# Patient Record
Sex: Female | Born: 1940 | Race: White | Hispanic: No | Marital: Married | Smoking: Never smoker
Health system: Southern US, Community
[De-identification: ages and names within clinical notes are randomized; demographics above are authoritative.]

## PROBLEM LIST (undated history)

## (undated) DIAGNOSIS — E785 Hyperlipidemia, unspecified: Secondary | ICD-10-CM

## (undated) DIAGNOSIS — E878 Other disorders of electrolyte and fluid balance, not elsewhere classified: Secondary | ICD-10-CM

## (undated) DIAGNOSIS — I10 Essential (primary) hypertension: Secondary | ICD-10-CM

## (undated) HISTORY — PX: BLADDER SURGERY: SHX569

---

## 2010-05-07 ENCOUNTER — Emergency Department (HOSPITAL_BASED_OUTPATIENT_CLINIC_OR_DEPARTMENT_OTHER): Admission: EM | Admit: 2010-05-07 | Discharge: 2010-05-07 | Payer: Self-pay | Admitting: Emergency Medicine

## 2015-06-05 ENCOUNTER — Encounter (HOSPITAL_BASED_OUTPATIENT_CLINIC_OR_DEPARTMENT_OTHER): Payer: Self-pay | Admitting: Emergency Medicine

## 2015-06-05 ENCOUNTER — Emergency Department (HOSPITAL_BASED_OUTPATIENT_CLINIC_OR_DEPARTMENT_OTHER)
Admission: EM | Admit: 2015-06-05 | Discharge: 2015-06-06 | Disposition: A | Discharge status: Home / Self Care | Payer: Medicare Other | Attending: Emergency Medicine | Admitting: Emergency Medicine

## 2015-06-05 DIAGNOSIS — Z791 Long term (current) use of non-steroidal anti-inflammatories (NSAID): Secondary | ICD-10-CM | POA: Diagnosis not present

## 2015-06-05 DIAGNOSIS — E871 Hypo-osmolality and hyponatremia: Secondary | ICD-10-CM | POA: Insufficient documentation

## 2015-06-05 DIAGNOSIS — Z79899 Other long term (current) drug therapy: Secondary | ICD-10-CM | POA: Insufficient documentation

## 2015-06-05 DIAGNOSIS — R350 Frequency of micturition: Secondary | ICD-10-CM | POA: Diagnosis present

## 2015-06-05 DIAGNOSIS — E860 Dehydration: Secondary | ICD-10-CM | POA: Diagnosis not present

## 2015-06-05 DIAGNOSIS — N390 Urinary tract infection, site not specified: Secondary | ICD-10-CM | POA: Diagnosis not present

## 2015-06-05 DIAGNOSIS — Z7982 Long term (current) use of aspirin: Secondary | ICD-10-CM | POA: Insufficient documentation

## 2015-06-05 DIAGNOSIS — E785 Hyperlipidemia, unspecified: Secondary | ICD-10-CM | POA: Insufficient documentation

## 2015-06-05 DIAGNOSIS — N3000 Acute cystitis without hematuria: Secondary | ICD-10-CM | POA: Diagnosis not present

## 2015-06-05 DIAGNOSIS — R300 Dysuria: Secondary | ICD-10-CM

## 2015-06-05 HISTORY — DX: Other disorders of electrolyte and fluid balance, not elsewhere classified: E87.8

## 2015-06-05 LAB — URINE MICROSCOPIC-ADD ON

## 2015-06-05 LAB — URINALYSIS, ROUTINE W REFLEX MICROSCOPIC
Bilirubin Urine: NEGATIVE
GLUCOSE, UA: NEGATIVE mg/dL
HGB URINE DIPSTICK: NEGATIVE
Ketones, ur: NEGATIVE mg/dL
Leukocytes, UA: NEGATIVE
Nitrite: POSITIVE — AB
Protein, ur: NEGATIVE mg/dL
SPECIFIC GRAVITY, URINE: 1.005 (ref 1.005–1.030)
Urobilinogen, UA: 1 mg/dL (ref 0.0–1.0)
pH: 7 (ref 5.0–8.0)

## 2015-06-05 MED ORDER — ACETAMINOPHEN 325 MG PO TABS
650.0000 mg | ORAL_TABLET | Freq: Once | ORAL | Status: AC
  Administered 2015-06-06: 650 mg via ORAL
  Filled 2015-06-05: qty 2

## 2015-06-05 MED ORDER — CEPHALEXIN 250 MG PO CAPS
500.0000 mg | ORAL_CAPSULE | Freq: Once | ORAL | Status: AC
Start: 2015-06-05 — End: 2015-06-06
  Administered 2015-06-06: 500 mg via ORAL
  Filled 2015-06-05: qty 2

## 2015-06-05 MED ORDER — FLUCONAZOLE 50 MG PO TABS
150.0000 mg | ORAL_TABLET | Freq: Once | ORAL | Status: AC
  Administered 2015-06-06: 150 mg via ORAL
  Filled 2015-06-05 (×2): qty 1

## 2015-06-05 MED ORDER — ONDANSETRON 4 MG PO TBDP
4.0000 mg | ORAL_TABLET | Freq: Once | ORAL | Status: AC
  Administered 2015-06-05: 4 mg via ORAL
  Filled 2015-06-05: qty 1

## 2015-06-05 MED ORDER — PHENAZOPYRIDINE HCL 100 MG PO TABS
200.0000 mg | ORAL_TABLET | Freq: Once | ORAL | Status: AC
  Administered 2015-06-06: 200 mg via ORAL
  Filled 2015-06-05: qty 2

## 2015-06-05 MED ORDER — CEPHALEXIN 500 MG PO CAPS: 500.0000 mg | ORAL_CAPSULE | Freq: Two times a day (BID) | ORAL | Status: DC

## 2015-06-05 MED ORDER — PHENAZOPYRIDINE HCL 200 MG PO TABS: 200.0000 mg | ORAL_TABLET | Freq: Three times a day (TID) | ORAL | Status: DC

## 2015-06-05 NOTE — ED Notes (Signed)
Dr. Read Drivers at [facility]. Pt alert, NAD, restless, anxious, c/o dysuria and vaginal/pelvic discomfort, rates 4/10, also frequency and urgency and nausea (denies: sob, v,d, fever or other sx), treated for UTI, taking septra and AZO.

## 2015-06-05 NOTE — Discharge Instructions (Signed)

## 2015-06-05 NOTE — ED Notes (Signed)
Patient reports that she has a day and half of antibiotics left until completed for the UTI that she was dx with. The patient reports that she feels the symptoms coming back

## 2015-06-05 NOTE — ED Provider Notes (Signed)
CSN: [id number]     Arrival date & time 06/05/15  2031 History  This chart was scribed for Kristine Libra, MD by Lyndel Safe, ED Scribe. This patient was seen in room MH09/MH09 and the patient's care was started 11:23 PM.    Chief Complaint  Patient presents with  . Urinary Frequency   The history is provided by the patient. No language interpreter was used.   HPI Comments: Kristine Hernandez is a 74 y.o. female who presents to the Emergency Department complaining of worsening frequency and urgency with urination onset today. The pt was diagnosed with a UTI after being evaluated at a walk-in clinic 5 days ago presenting c/o dysuria (burning with urination) and frequency. She was prescribed septra and notes improvement before the return of her symptoms today; she has not completed her course. Pt reports associated nausea but believes this is attributed to her growing anxiety over her worsening urinary symptoms. Pt is currently taking Azo tablets. Denies fevers or chills, vomiting or diarrhea.   Past Medical History  Diagnosis Date  . Hyperchloremia    Past Surgical History  Procedure Laterality Date  . Bladder surgery     History reviewed. No pertinent family history. Social History  Substance Use Topics  . Smoking status: Never Smoker   . Smokeless tobacco: None  . Alcohol Use: None   OB History    No data available     Review of Systems 10 Systems reviewed and all are negative for acute change except as noted in the HPI.  Allergies  Penicillins  Home Medications   Prior to Admission medications   Medication Sig Start Date End Date Taking? Authorizing Provider  rosuvastatin (CRESTOR) 10 MG tablet Take 10 mg by mouth every Friday.   Yes Historical Provider, MD  cephALEXin (KEFLEX) 500 MG capsule Take 1 capsule (500 mg total) by mouth 2 (two) times daily. 06/05/15   John Molpus, MD  phenazopyridine (PYRIDIUM) 200 MG tablet Take 1 tablet (200 mg total) by mouth 3 (three) times daily.  06/05/15   John Molpus, MD   BP 119/80 mmHg  Pulse 72  Temp(Src) 98.1 F (36.7 C) (Oral)  Resp 16  Ht  (1.702 m)  Wt 150 lb (68.04 kg)  BMI 23.49 kg/m2  SpO2 98%  Physical Exam General: Well-developed, well-nourished female in no acute distress; appearance consistent with age of record HENT: normocephalic; atraumatic Eyes: pupils equal, round and reactive to light; extraocular muscles intact Neck: supple Heart: regular rate and rhythm Lungs: clear to auscultation bilaterally Abdomen: soft; nondistended; mild suprapubic tenderness; no masses or hepatosplenomegaly; bowel sounds present GU: no CVA tenderness  Extremities: No deformity; full range of motion; pulses normal Neurologic: Awake, alert and oriented; motor function intact in all extremities and symmetric; no facial droop Skin: Warm and dry Psychiatric: anxious   ED Course  Procedures  DIAGNOSTIC STUDIES: Oxygen Saturation is 98% on RA, normal by my interpretation.    COORDINATION OF CARE: 11:30 PMb Discussed treatment plan with pt at bedside and pt agreed to plan.    MDM  Will give Diflucan  for possible yeast infection. Will change antibiotic to Keflex  BID for possible resistant organism. Urine sent for culture. Will change Azo to prescription strength Pyridium.   Final diagnoses:  Dysuria   I personally performed the services described in this documentation, which was scribed in my presence. The recorded information has been reviewed and is accurate.    Kristine Libra, MD 06/05/15 [phone]

## 2015-06-06 ENCOUNTER — Encounter (HOSPITAL_BASED_OUTPATIENT_CLINIC_OR_DEPARTMENT_OTHER): Payer: Self-pay | Admitting: *Deleted

## 2015-06-06 ENCOUNTER — Inpatient Hospital Stay (HOSPITAL_COMMUNITY): Payer: Medicare Other

## 2015-06-06 ENCOUNTER — Observation Stay (EMERGENCY_DEPARTMENT_HOSPITAL)
Admission: EM | Admit: 2015-06-06 | Discharge: 2015-06-07 | Disposition: A | Discharge status: Home / Self Care | Payer: Medicare Other | Source: Home / Self Care | Attending: Emergency Medicine | Admitting: Emergency Medicine

## 2015-06-06 ENCOUNTER — Emergency Department (HOSPITAL_BASED_OUTPATIENT_CLINIC_OR_DEPARTMENT_OTHER): Payer: Medicare Other

## 2015-06-06 DIAGNOSIS — N3000 Acute cystitis without hematuria: Secondary | ICD-10-CM | POA: Diagnosis not present

## 2015-06-06 DIAGNOSIS — E785 Hyperlipidemia, unspecified: Secondary | ICD-10-CM | POA: Diagnosis present

## 2015-06-06 DIAGNOSIS — E860 Dehydration: Secondary | ICD-10-CM | POA: Diagnosis not present

## 2015-06-06 DIAGNOSIS — N390 Urinary tract infection, site not specified: Secondary | ICD-10-CM | POA: Diagnosis present

## 2015-06-06 DIAGNOSIS — E871 Hypo-osmolality and hyponatremia: Secondary | ICD-10-CM | POA: Diagnosis present

## 2015-06-06 HISTORY — DX: Hyperlipidemia, unspecified: E78.5

## 2015-06-06 LAB — COMPREHENSIVE METABOLIC PANEL
ALT: 16 U/L (ref 14–54)
ANION GAP: 10 (ref 5–15)
AST: 30 U/L (ref 15–41)
Albumin: 4.2 g/dL (ref 3.5–5.0)
Alkaline Phosphatase: 63 U/L (ref 38–126)
BUN: 8 mg/dL (ref 6–20)
CALCIUM: 9.4 mg/dL (ref 8.9–10.3)
CHLORIDE: 87 mmol/L — AB (ref 101–111)
CO2: 23 mmol/L (ref 22–32)
CREATININE: 0.7 mg/dL (ref 0.44–1.00)
Glucose, Bld: 135 mg/dL — ABNORMAL HIGH (ref 65–99)
Potassium: 3.7 mmol/L (ref 3.5–5.1)
Sodium: 120 mmol/L — ABNORMAL LOW (ref 135–145)
Total Bilirubin: 1 mg/dL (ref 0.3–1.2)
Total Protein: 6.7 g/dL (ref 6.5–8.1)

## 2015-06-06 LAB — SODIUM, URINE, RANDOM
SODIUM UR: 50 mmol/L
Sodium, Ur: 49 mmol/L

## 2015-06-06 LAB — MAGNESIUM: Magnesium: 2 mg/dL (ref 1.7–2.4)

## 2015-06-06 LAB — BASIC METABOLIC PANEL
ANION GAP: 10 (ref 5–15)
BUN: 6 mg/dL (ref 6–20)
CALCIUM: 9.9 mg/dL (ref 8.9–10.3)
CO2: 24 mmol/L (ref 22–32)
CREATININE: 0.68 mg/dL (ref 0.44–1.00)
Chloride: 96 mmol/L — ABNORMAL LOW (ref 101–111)
GFR calc Af Amer: >60 mL/min (ref >60)
GFR calc non Af Amer: >60 mL/min (ref >60)
GLUCOSE: 120 mg/dL — AB (ref 65–99)
Potassium: 4 mmol/L (ref 3.5–5.1)
Sodium: 130 mmol/L — ABNORMAL LOW (ref 135–145)

## 2015-06-06 LAB — CBC
HCT: 37.3 % (ref 36.0–46.0)
HEMOGLOBIN: 12.6 g/dL (ref 12.0–15.0)
MCH: 29 pg (ref 26.0–34.0)
MCHC: 33.8 g/dL (ref 30.0–36.0)
MCV: 85.9 fL (ref 78.0–100.0)
PLATELETS: 147 10*3/uL — AB (ref 150–400)
RBC: 4.34 MIL/uL (ref 3.87–5.11)
RDW: 12.4 % (ref 11.5–15.5)
WBC: 5.1 10*3/uL (ref 4.0–10.5)

## 2015-06-06 LAB — CORTISOL: CORTISOL PLASMA: 15.9 ug/dL

## 2015-06-06 LAB — OSMOLALITY, URINE: OSMOLALITY UR: 138 mosm/kg — AB (ref 390–1090)

## 2015-06-06 LAB — CREATININE, URINE, RANDOM: Creatinine, Urine: 14.67 mg/dL

## 2015-06-06 LAB — TSH: TSH: 0.979 u[IU]/mL (ref 0.350–4.500)

## 2015-06-06 LAB — LIPASE, BLOOD: LIPASE: 20 U/L — AB (ref 22–51)

## 2015-06-06 MED ORDER — ASPIRIN EC 81 MG PO TBEC
81.0000 mg | DELAYED_RELEASE_TABLET | Freq: Every day | ORAL | Status: DC
  Administered 2015-06-06 – 2015-06-07 (×2): 81 mg via ORAL
  Filled 2015-06-06 (×2): qty 1

## 2015-06-06 MED ORDER — OXYBUTYNIN CHLORIDE 5 MG PO TABS: 5.0000 mg | ORAL_TABLET | Freq: Three times a day (TID) | ORAL | Status: DC | PRN

## 2015-06-06 MED ORDER — IBUPROFEN 200 MG PO TABS
400.0000 mg | ORAL_TABLET | Freq: Four times a day (QID) | ORAL | Status: DC | PRN
  Administered 2015-06-06: 400 mg via ORAL
  Filled 2015-06-06: qty 2

## 2015-06-06 MED ORDER — ACETAMINOPHEN 650 MG RE SUPP: 650.0000 mg | Freq: Four times a day (QID) | RECTAL | Status: DC | PRN

## 2015-06-06 MED ORDER — ROSUVASTATIN CALCIUM 10 MG PO TABS: 10.0000 mg | ORAL_TABLET | ORAL | Status: DC

## 2015-06-06 MED ORDER — SORBITOL 70 % SOLN
30.0000 mL | Freq: Every day | Status: DC | PRN
  Filled 2015-06-06: qty 30

## 2015-06-06 MED ORDER — DOCUSATE SODIUM 100 MG PO CAPS
100.0000 mg | ORAL_CAPSULE | Freq: Every day | ORAL | Status: DC
  Administered 2015-06-07: 100 mg via ORAL
  Filled 2015-06-06: qty 1

## 2015-06-06 MED ORDER — SODIUM CHLORIDE 0.9 % IV SOLN
INTRAVENOUS | Status: DC
  Administered 2015-06-07: 11:00:00 via INTRAVENOUS

## 2015-06-06 MED ORDER — BACID PO TABS
1.0000 | ORAL_TABLET | Freq: Every day | ORAL | Status: DC
  Filled 2015-06-06 (×2): qty 1

## 2015-06-06 MED ORDER — ONDANSETRON 4 MG PO TBDP
4.0000 mg | ORAL_TABLET | Freq: Once | ORAL | Status: AC
Start: 2015-06-06 — End: 2015-06-06
  Administered 2015-06-06: 4 mg via ORAL
  Filled 2015-06-06: qty 1

## 2015-06-06 MED ORDER — ONDANSETRON HCL 4 MG/2ML IJ SOLN: 4.0000 mg | Freq: Four times a day (QID) | INTRAMUSCULAR | Status: DC | PRN

## 2015-06-06 MED ORDER — ADULT MULTIVITAMIN W/MINERALS CH
1.0000 | ORAL_TABLET | Freq: Every day | ORAL | Status: DC
  Administered 2015-06-07: 1 via ORAL
  Filled 2015-06-06: qty 1

## 2015-06-06 MED ORDER — SODIUM CHLORIDE 0.9 % IV BOLUS (SEPSIS)
1000.0000 mL | Freq: Once | INTRAVENOUS | Status: AC
  Administered 2015-06-06: 1000 mL via INTRAVENOUS

## 2015-06-06 MED ORDER — ENOXAPARIN SODIUM 40 MG/0.4ML ~~LOC~~ SOLN
40.0000 mg | SUBCUTANEOUS | Status: DC
  Administered 2015-06-06: 40 mg via SUBCUTANEOUS
  Filled 2015-06-06 (×2): qty 0.4

## 2015-06-06 MED ORDER — CYCLOSPORINE 0.05 % OP EMUL
1.0000 [drp] | Freq: Two times a day (BID) | OPHTHALMIC | Status: DC
  Administered 2015-06-06: 1 [drp] via OPHTHALMIC
  Administered 2015-06-07: 10:00:00 via OPHTHALMIC
  Filled 2015-06-06 (×3): qty 1

## 2015-06-06 MED ORDER — ACETAMINOPHEN 325 MG PO TABS
650.0000 mg | ORAL_TABLET | Freq: Four times a day (QID) | ORAL | Status: DC | PRN
  Administered 2015-06-07: 650 mg via ORAL
  Filled 2015-06-06: qty 2

## 2015-06-06 MED ORDER — ALBUTEROL SULFATE (2.5 MG/3ML) 0.083% IN NEBU: 2.5000 mg | INHALATION_SOLUTION | RESPIRATORY_TRACT | Status: DC | PRN

## 2015-06-06 MED ORDER — SENNOSIDES-DOCUSATE SODIUM 8.6-50 MG PO TABS: 1.0000 | ORAL_TABLET | Freq: Every evening | ORAL | Status: DC | PRN

## 2015-06-06 MED ORDER — DEXTROSE 5 % IV SOLN
1.0000 g | INTRAVENOUS | Status: DC
  Administered 2015-06-06 – 2015-06-07 (×2): 1 g via INTRAVENOUS
  Filled 2015-06-06 (×2): qty 10

## 2015-06-06 MED ORDER — TRAMADOL HCL 50 MG PO TABS: 50.0000 mg | ORAL_TABLET | Freq: Four times a day (QID) | ORAL | Status: DC | PRN

## 2015-06-06 MED ORDER — ONDANSETRON HCL 4 MG PO TABS
4.0000 mg | ORAL_TABLET | Freq: Four times a day (QID) | ORAL | Status: DC | PRN
  Administered 2015-06-07: 4 mg via ORAL
  Filled 2015-06-06: qty 1

## 2015-06-06 MED ORDER — VITAMIN D3 25 MCG (1000 UNIT) PO TABS
1000.0000 [IU] | ORAL_TABLET | Freq: Every day | ORAL | Status: DC
Start: 2015-06-07 — End: 2015-06-07
  Administered 2015-06-07: 1000 [IU] via ORAL
  Filled 2015-06-06: qty 1

## 2015-06-06 MED ORDER — MAGNESIUM CHLORIDE 64 MG PO TBEC
1.0000 | DELAYED_RELEASE_TABLET | Freq: Every day | ORAL | Status: DC
  Administered 2015-06-07: 64 mg via ORAL
  Filled 2015-06-06: qty 1

## 2015-06-06 NOTE — ED Notes (Signed)
Lab notified of additional lab test requested by EDP

## 2015-06-06 NOTE — ED Notes (Signed)
Pt was seen at this ED last night, per pt statement left approx 0100hrs, pt states not feeling better, has not yet filled any of the prescriptions as of this return time.

## 2015-06-06 NOTE — ED Provider Notes (Signed)
CSN: [id number]     Arrival date & time 06/06/15  0759 History   First MD Initiated Contact with Patient 06/06/15 0809     Chief Complaint  Patient presents with  . Abdominal Pain    HPI Kristine Hernandez is a 74 y.o. female presenting to the ED for continued abdominal pain, urinary frequency, and nausea. Patient was just seen last night in the ED for similar symptoms. At that time her antibiotics were changed. Patient has not filled medications since discharge from ED. She states her symptoms are worsening. She has had UTI symptoms for a total of 7 days. With the increased urination she has associated increased thirst. Denies any diuretic use.   Of note, patient does have history of mesh placement 10 years ago for bladder prolapse.  Past Medical History  Diagnosis Date  . Hyperchloremia    Past Surgical History  Procedure Laterality Date  . Bladder surgery     History reviewed. No pertinent family history. Social History  Substance Use Topics  . Smoking status: Never Smoker   . Smokeless tobacco: None  . Alcohol Use: None   OB History    No data available     Review of Systems  Constitutional: Negative for fever.  Gastrointestinal: Positive for abdominal pain.  Genitourinary: Positive for dysuria, frequency and pelvic pain. Negative for hematuria and flank pain.  Also per HPI  Allergies  Penicillins  Home Medications   Prior to Admission medications   Medication Sig Start Date End Date Taking? Authorizing Provider  cephALEXin (KEFLEX) 500 MG capsule Take 1 capsule (500 mg total) by mouth 2 (two) times daily. 06/05/15   John Molpus, MD  phenazopyridine (PYRIDIUM) 200 MG tablet Take 1 tablet (200 mg total) by mouth 3 (three) times daily. 06/05/15   John Molpus, MD  Phenazopyridine HCl (AZO TABS PO) Take by mouth.    Historical Provider, MD  rosuvastatin (CRESTOR) 10 MG tablet Take 10 mg by mouth every Friday.    Historical Provider, MD  Sulfamethoxazole-Trimethoprim (SEPTRA PO)  Take by mouth.    Historical Provider, MD   BP 143/70 mmHg  Pulse 85  Temp(Src) 98.1 F (36.7 C) (Oral)  Resp 24  Ht  (1.702 m)  Wt 150 lb (68.04 kg)  BMI 23.49 kg/m2  SpO2 98% Physical Exam  Constitutional: She is oriented to person, place, and time. She appears well-developed and well-nourished. No distress.  HENT:  Head: Normocephalic and atraumatic.  Eyes: EOM are normal.  Cardiovascular: Normal rate, regular rhythm and intact distal pulses.   Pulmonary/Chest: Effort normal and breath sounds normal.  Abdominal: Soft. Normal appearance and bowel sounds are normal. There is tenderness in the suprapubic area. There is no CVA tenderness.  Musculoskeletal:  Bilateral ankle edema  Neurological: She is alert and oriented to person, place, and time.  No focal deficits  Skin: Skin is warm, dry and intact.  Psychiatric: Her mood appears anxious.    ED Course  Procedures (including critical care time) Labs Review Labs Reviewed  COMPREHENSIVE METABOLIC PANEL - Abnormal; Notable for the following:    Sodium 120 (*)    Chloride 87 (*)    Glucose, Bld 135 (*)    All other components within normal limits  CBC - Abnormal; Notable for the following:    Platelets 147 (*)    All other components within normal limits  LIPASE, BLOOD - Abnormal; Notable for the following:    Lipase 20 (*)    All  other components within normal limits  SODIUM, URINE, RANDOM  OSMOLALITY, URINE  OSMOLALITY    Imaging Review Ct Abdomen Pelvis Wo Contrast  06/06/2015   CLINICAL DATA:  Bladder pain continues. Recent treatment for UTI without improvement.  EXAM: CT ABDOMEN AND PELVIS WITHOUT CONTRAST  TECHNIQUE: Multidetector CT imaging of the abdomen and pelvis was performed following the standard protocol without IV contrast.  COMPARISON:  None.  FINDINGS: Lower chest:  No acute findings.  Hepatobiliary: No mass visualized on this un-enhanced exam.  Pancreas: Calcifications within the pancreatic body may  reflect changes of chronic pancreatitis. No active inflammation or focal abnormality.  Spleen: Within normal limits in size.  Adrenals/Urinary Tract: No evidence of urolithiasis or hydronephrosis. No definite mass visualized on this un-enhanced exam. Urinary bladder is grossly unremarkable.  Stomach/Bowel: No evidence of obstruction, inflammatory process, or abnormal fluid collections.  Vascular/Lymphatic: Calcifications in the aorta and iliac vessels without aneurysm. No adenopathy.  Reproductive: No mass or other significant abnormality.  Other: No free fluid or free air.  Musculoskeletal:  Degenerative changes throughout the lumbar spine.  IMPRESSION: No renal or ureteral stones.  No hydronephrosis.  No acute findings.  Calcifications within the pancreatic body may reflect changes from chronic pancreatitis.  No acute findings.  Aortoiliac atherosclerosis.   Electronically Signed   By: Charlett Nose M.D.   On: 06/06/2015 09:07   I have personally reviewed and evaluated these images and lab results as part of my medical decision-making.   EKG Interpretation None      MDM   Final diagnoses:  None   Patient representing to ED for continued lower abdominal pain, urinary frequency/burning, and nausea. States that since leaving ED symtpoms worsening. On exam patient well appearing. She had not gotten her refills for her UTI medications. Afebrile and vitals stable.   Will do abdominal imaging at this time to rule out any abnormal findings. Imaging was negative for any acute pathology. Basic labs obtained were concerning for hyponatremia to 120; patient stable. No prior labs to compare. Urine from yesterday with low specific gravity and nitrites; no bacteria or leukocytes.   1L NS bolus given with zofran.   Will admit patient for observation and further work-up for low sodium with correction. Concern for SIADH in euvolemic patient with symptoms of increased thirst and urination. Orders placed for urine  and sodium osmolality.    Caryl Ada, DO 06/06/2015, 10:02 AM PGY-2, [facility]    Pincus Large, DO 06/06/15 1016  Melene Plan, DO 06/06/15 1331  Melene Plan, DO 06/06/15 1344

## 2015-06-06 NOTE — ED Notes (Addendum)
Here last pm, abdominal pain, nausea, urinary frequency, rec medication (keflex and pyridium), states feels worse

## 2015-06-06 NOTE — ED Provider Notes (Addendum)
I have personally seen and examined the patient.  I have discussed the plan of care with the resident.  I have reviewed the documentation on PMH/FH/Soc. History.  I have reviewed the documentation of the resident and agree.   EKG Interpretation  Date/Time:    Ventricular Rate:    PR Interval:    QRS Duration:   QT Interval:    QTC Calculation:   R Axis:     Text Interpretation:          74 yo F with a chief complaint of lower abdominal pain. This is a burning sensation. Patient feels like it's not necessary related to urination. Patient's pain is worse with standing and moving. Patient has had increased frequency dysuria hesitancy. Seen about a week ago for the same diagnosed with a UTI started on Bactrim. Patient came in today with continued symptoms. Seen last night changed antibiotic to Keflex. Patient did not get this filled and came back because she felt like it had not improved.   On exam well-appearing. Marked tachypnic By triage nurse however on my exam was breathing about 18 times a minute. Benign abdominal exam.  We'll obtain a CT stone study due to CBC and CMP.  Patient found to have marked hyponatremia. No known diagnosis of this previously. Patient symptomatically with nausea some lower abdominal cramping. Will obtain serum and urine osmolality and urine sodium. Patient drinking a lot of water here and having a lot of urine output. Concern for possible SIADH. Will Admit.    Melene Plan, DO 06/06/15 1012  Melene Plan, DO 06/06/15 1332

## 2015-06-06 NOTE — ED Notes (Signed)
MD at bedside. 

## 2015-06-06 NOTE — ED Notes (Addendum)
Patient transported to CT, via stretcher, sr x 2 up 

## 2015-06-06 NOTE — Progress Notes (Signed)
74 y.o. female who presents to the Emergency Department complaining of worsening frequency and urgency with urination onset today. The pt was diagnosed with a UTI recently, repeat UA negative, CT scan negative however patient was found to be hyponatremic with a sodium of 120. Therefore the patient has been accepted to telemetry at [facility] for hyponatremia workup

## 2015-06-06 NOTE — H&P (Signed)
[facility] History and Physical  Kristine Hernandez EAV:[id number] DOB: 10-Jun-1941 DOA: 06/06/2015  Referring physician: Dr. Adela Lank PCP: Dan Maker, MD   Chief Complaint: Lower abdominal pain, dysuria  HPI: Kristine Hernandez is a 74 y.o. female  With a history of hyperlipidemia who presents to the ED with lower abdominal pain, dysuria, urinary hesitancy and increased frequency. Patient states 1 week prior to admission had symptoms of dysuria, polyuria, urinary frequency and presented to PCPs office which was diagnosed with a UTI and placed on Septra and sent home. Patient stated that she initially felt better and then subsequently worsened with increased urination, dysuria, nausea, generalized weakness, chills and subsequently presented to the ED at the [facility] the night prior to admission. Patient stated that over there she was reassessed placed on Keflex and discharged home. Patient had been unable to get the Keflex medication yet. While at home patient stated that her symptoms worsened and she subsequently presented back to the ED, on the day of admission. Patient denies any fevers, no cough, no hematemesis, no hematochezia, no melanoma, no shortness of breath, no chest pain, no visual changes. Patient does endorse good oral intake and good urinary output. Patient states she goes between bouts of diarrhea and constipation.  Patient was seen in the ED again compressive metabolic profile done had a sodium of 120 chloride of 87 glucose of 135 otherwise is within normal limits. Lipase level was decreased at 20. CBC had a platelet count of 147 otherwise was within normal limits. Urinalysis done the night prior to admission the ED was leukocytes negative nitrite positive. Urine cultures pending. CT abdomen and pelvis was also obtained with no acute abnormalities. Chart Hoss was were called to admit the patient for further evaluation and management secondary to her hyponatremia.   Review of Systems:   As per history of present illness otherwise negative. Constitutional:  No weight loss, night sweats, Fevers, chills, fatigue.  HEENT:  No headaches, Difficulty swallowing,Tooth/dental problems,Sore throat,  No sneezing, itching, ear ache, nasal congestion, post nasal drip,  Cardio-vascular:  No chest pain, Orthopnea, PND, swelling in lower extremities, anasarca, dizziness, palpitations  GI:  No heartburn, indigestion, abdominal pain, nausea, vomiting, diarrhea, change in bowel habits, loss of appetite  Resp:  No shortness of breath with exertion or at rest. No excess mucus, no productive cough, No non-productive cough, No coughing up of blood.No change in color of mucus.No wheezing.No chest wall deformity  Skin:  no rash or lesions.  GU:  no dysuria, change in color of urine, no urgency or frequency. No flank pain.  Musculoskeletal:  No joint pain or swelling. No decreased range of motion. No back pain.  Psych:  No change in mood or affect. No depression or anxiety. No memory loss.   Past Medical History  Diagnosis Date  . Hyperchloremia   . Hyperlipidemia 06/06/2015   Past Surgical History  Procedure Laterality Date  . Bladder surgery     Social History:  reports that she has never smoked. She does not have any smokeless tobacco history on file. She reports that she drinks alcohol. She reports that she does not use illicit drugs.  Allergies  Allergen Reactions  . Penicillins Rash    Has patient had a PCN reaction causing immediate rash, facial/tongue/throat swelling, SOB or lightheadedness with hypotension: No Has patient had a PCN reaction causing severe rash involving mucus membranes or skin necrosis: No Has patient had a PCN reaction that required hospitalization No Has patient  had a PCN reaction occurring within the last 10 years: No If all of the above answers are "NO", then may proceed with Cephalosporin use.    Family History  Problem Relation Age of Onset  .  Dementia Mother   . CVA Father     Prior to Admission medications   Medication Sig Start Date End Date Taking? Authorizing Provider  acetaminophen (TYLENOL) 500 MG tablet Take 1,000 mg by mouth every 6 (six) hours as needed for moderate pain.   Yes Historical Provider, MD  aspirin EC 81 MG tablet Take 81 mg by mouth daily.   Yes Historical Provider, MD  BIOTIN PO Take 1 tablet by mouth daily.   Yes Historical Provider, MD  Calcium Carbonate-Vitamin D (CALTRATE 600+D PO) Take 1 tablet by mouth daily.   Yes Historical Provider, MD  cholecalciferol (VITAMIN D) 1000 UNITS tablet Take 1,000 Units by mouth daily.   Yes Historical Provider, MD  cycloSPORINE (RESTASIS) 0.05 % ophthalmic emulsion Place 1 drop into both eyes 2 (two) times daily.   Yes Historical Provider, MD  docusate sodium (COLACE) 100 MG capsule Take 100 mg by mouth daily.   Yes Historical Provider, MD  ibuprofen (ADVIL,MOTRIN) 200 MG tablet Take 400 mg by mouth every 6 (six) hours as needed for moderate pain.   Yes Historical Provider, MD  lactobacillus acidophilus (BACID) TABS tablet Take 1 tablet by mouth daily.   Yes Historical Provider, MD  Magnesium 250 MG TABS Take 250 mg by mouth daily.   Yes Historical Provider, MD  Multiple Vitamin (MULTIVITAMIN WITH MINERALS) TABS tablet Take 1 tablet by mouth daily.   Yes Historical Provider, MD  Phenazopyridine HCl (AZO TABS PO) Take 1 tablet by mouth every 8 (eight) hours as needed (urinary pain).    Yes Historical Provider, MD  rosuvastatin (CRESTOR) 10 MG tablet Take 10 mg by mouth every Friday.   Yes Historical Provider, MD  cephALEXin (KEFLEX) 500 MG capsule Take 1 capsule (500 mg total) by mouth 2 (two) times daily. Patient taking differently: Take 500 mg by mouth 2 (two) times daily. NOT FILLED YET 06/05/15   [name], MD  phenazopyridine (PYRIDIUM) 200 MG tablet Take 1 tablet (200 mg total) by mouth 3 (three) times daily. Patient taking differently: Take 200 mg by mouth 3  (three) times daily. NOT FILLED YET 06/05/15   [name], MD   Physical Exam: Filed Vitals:   06/06/15 0820 06/06/15 0953 06/06/15 1029 06/06/15 1318  BP: 168/88 143/70 138/69 148/66  Pulse: 78 85 74 73  Temp: 98.1 F (36.7 C)  97.9 F (36.6 C) 98.5 F (36.9 C)  TempSrc: Oral  Oral Oral  Resp: Height:     (1.702 m)  Weight:    66.8 kg (147 lb 4.3 oz)  SpO2: 98% 98% 99% 97%    Wt Readings from Last 3 Encounters:  06/06/15 66.8 kg (147 lb 4.3 oz)  06/05/15 68.04 kg (150 lb)    General:  Well-developed sitting on bed in no acute cardiopulmonary distress. Speaking in full sentences.  Eyes: PERRLA, EOMI, normal lids, irises & conjunctiva ENT: grossly normal hearing, lips & tongue. Dry mucous membranes Neck: no LAD, masses or thyromegaly Cardiovascular: RRR, no m/r/g. No LE edema. Respiratory: CTA bilaterally, no w/r/r. Normal respiratory effort. Abdomen: soft, nondistended, tender to palpation in the suprapubic region, positive bowel sounds, no rebound, no guarding Skin: no rash or induration seen on limited exam Musculoskeletal: grossly normal  tone BUE/BLE Psychiatric: grossly normal mood and affect, speech fluent and appropriate Neurologic: Alert and oriented 3. CN2- 12 are grossly intact. No focal deficits.           Labs on Admission:  Basic Metabolic Panel:  Recent Labs Lab 06/06/15 0853  NA 120*  K 3.7  CL 87*  CO2 23  GLUCOSE 135*  BUN 8  CREATININE 0.70  CALCIUM 9.4   Liver Function Tests:  Recent Labs Lab 06/06/15 0853  AST 30  ALT 16  ALKPHOS 63  BILITOT 1.0  PROT 6.7  ALBUMIN 4.2    Recent Labs Lab 06/06/15 0853  LIPASE 20*   No results for input(s): AMMONIA in the last 168 hours. CBC:  Recent Labs Lab 06/06/15 0853  WBC 5.1  HGB 12.6  HCT 37.3  MCV 85.9  PLT 147*   Cardiac Enzymes: No results for input(s): CKTOTAL, CKMB, CKMBINDEX, TROPONINI in the last 168 hours.  BNP (last 3 results) No results for  input(s): BNP in the last 8760 hours.  ProBNP (last 3 results) No results for input(s): PROBNP in the last 8760 hours.  CBG: No results for input(s): GLUCAP in the last 168 hours.  Radiological Exams on Admission: Ct Abdomen Pelvis Wo Contrast  06/06/2015   CLINICAL DATA:  Bladder pain continues. Recent treatment for UTI without improvement.  EXAM: CT ABDOMEN AND PELVIS WITHOUT CONTRAST  TECHNIQUE: Multidetector CT imaging of the abdomen and pelvis was performed following the standard protocol without IV contrast.  COMPARISON:  None.  FINDINGS: Lower chest:  No acute findings.  Hepatobiliary: No mass visualized on this un-enhanced exam.  Pancreas: Calcifications within the pancreatic body may reflect changes of chronic pancreatitis. No active inflammation or focal abnormality.  Spleen: Within normal limits in size.  Adrenals/Urinary Tract: No evidence of urolithiasis or hydronephrosis. No definite mass visualized on this un-enhanced exam. Urinary bladder is grossly unremarkable.  Stomach/Bowel: No evidence of obstruction, inflammatory process, or abnormal fluid collections.  Vascular/Lymphatic: Calcifications in the aorta and iliac vessels without aneurysm. No adenopathy.  Reproductive: No mass or other significant abnormality.  Other: No free fluid or free air.  Musculoskeletal:  Degenerative changes throughout the lumbar spine.  IMPRESSION: No renal or ureteral stones.  No hydronephrosis.  No acute findings.  Calcifications within the pancreatic body may reflect changes from chronic pancreatitis.  No acute findings.  Aortoiliac atherosclerosis.   Electronically Signed   By: Charlett Nose M.D.   On: 06/06/2015 09:07    EKG: None  Assessment/Plan Principal Problem:   Hyponatremia Active Problems:   UTI (urinary tract infection)   Hyperlipidemia   Dehydration  #1 hyponatremia Likely secondary to hypovolemic hyponatremia as patient looks clinically dehydrated on physical exam. Will check a  fractional excretion of sodium. Check a urine osmolality. Check a serum osmolality. Check a TSH. Check a cortisol level. Check a chest x-Doloros. Check orthostatics. We'll place patient empirically on IV fluids. Follow.  #2 urinary tract infection Patient was initially diagnosed with a UTI approximately one week prior to admission had been placed on Bactrim with no significant improvement. Urinalysis with cultures and sensitivities were drawn the night prior to admission when she had presented to the ED with results pending. Patient also did have a UA with cultures and sensitivities done at PCPs office 1 week prior to admission. Will need to contact the PCPs office for results on urine cultures. We'll place empirically on IV Rocephin for now. Follow.  #3 dehydration IV fluids.  #  4 hyperlipidemia Continue home regimen of Crestor.  #5 prophylaxis Lovenox for DVT prophylaxis.  Code Status: Full DVT Prophylaxis: Lovenox. Family Communication: Updated patient and husband at bedside. Disposition Plan: Admit to MedSurg.  Time spent: 59 MINS  [facility] MD [facility] Pager [phone]

## 2015-06-06 NOTE — ED Notes (Signed)
Pt c/o cont abd pain

## 2015-06-06 NOTE — ED Notes (Signed)
Pt placed in gown, warm blanket provided.

## 2015-06-06 NOTE — ED Notes (Signed)
MD at bedside. To update family of radiology and lab results and plan of care

## 2015-06-07 LAB — URINE CULTURE: CULTURE: NO GROWTH

## 2015-06-07 LAB — BASIC METABOLIC PANEL
ANION GAP: 3 — AB (ref 5–15)
BUN: 8 mg/dL (ref 6–20)
CHLORIDE: 109 mmol/L (ref 101–111)
CO2: 25 mmol/L (ref 22–32)
Calcium: 9.1 mg/dL (ref 8.9–10.3)
Creatinine, Ser: 0.7 mg/dL (ref 0.44–1.00)
GFR calc non Af Amer: >60 mL/min (ref >60)
Glucose, Bld: 101 mg/dL — ABNORMAL HIGH (ref 65–99)
POTASSIUM: 3.8 mmol/L (ref 3.5–5.1)
Sodium: 137 mmol/L (ref 135–145)

## 2015-06-07 LAB — CBC
HCT: 37.3 % (ref 36.0–46.0)
HEMOGLOBIN: 12.1 g/dL (ref 12.0–15.0)
MCH: 28.9 pg (ref 26.0–34.0)
MCHC: 32.4 g/dL (ref 30.0–36.0)
MCV: 89.2 fL (ref 78.0–100.0)
Platelets: 160 10*3/uL (ref 150–400)
RBC: 4.18 MIL/uL (ref 3.87–5.11)
RDW: 13.2 % (ref 11.5–15.5)
WBC: 4.7 10*3/uL (ref 4.0–10.5)

## 2015-06-07 LAB — OSMOLALITY: Osmolality: 254 mOsm/kg — ABNORMAL LOW (ref 275–300)

## 2015-06-07 MED ORDER — OXYBUTYNIN CHLORIDE ER 10 MG PO TB24
10.0000 mg | ORAL_TABLET | Freq: Every day | ORAL | Status: DC
Start: 2015-06-07 — End: 2018-06-05

## 2015-06-07 MED ORDER — CEFUROXIME AXETIL 500 MG PO TABS: 500.0000 mg | ORAL_TABLET | Freq: Two times a day (BID) | ORAL | Status: DC

## 2015-06-07 NOTE — Discharge Summary (Signed)
Physician Discharge Summary  Kristine Hernandez ZOX:[id number] DOB: 10-Feb-1941 DOA: 06/06/2015  PCP: Dan Maker, MD  Admit date: 06/06/2015 Discharge date: 06/07/2015  Time spent: 65 minutes  Recommendations for Outpatient Follow-up:  1. Follow up with Dan Maker, MD in 1 week. On follow-up patient will need a basic metabolic profile done to follow-up on electrolytes and renal function. Patient's UTI only to be reassessed.  Discharge Diagnoses:  Principal Problem:   Hyponatremia Active Problems:   UTI (urinary tract infection)   Hyperlipidemia   Dehydration   Discharge Condition: Stable and improved  Diet recommendation: Regular  Filed Weights   06/06/15 0805 06/06/15 1318  Weight: 68.04 kg (150 lb) 66.8 kg (147 lb 4.3 oz)    History of present illness:  Kristine Hernandez is a 74 y.o. female  With a history of hyperlipidemia who presented to the ED with lower abdominal pain, dysuria, urinary hesitancy and increased frequency. Patient stated 1 week prior to admission had symptoms of dysuria, polyuria, urinary frequency and presented to PCPs office which was diagnosed with a UTI and placed on Septra and sent home. Patient stated that she initially felt better and then subsequently worsened with increased urination, dysuria, nausea, generalized weakness, chills and subsequently presented to the ED at the [facility] the night prior to admission. Patient stated that over there she was reassessed placed on Keflex and discharged home. Patient had been unable to get the Keflex medication yet. While at home patient stated that her symptoms worsened and she subsequently presented back to the ED, on the day of admission. Patient denied any fevers, no cough, no hematemesis, no hematochezia, no melanoma, no shortness of breath, no chest pain, no visual changes. Patient did endorse good oral intake and good urinary output. Patient stated she goes between bouts of diarrhea and constipation.  Patient  was seen in the ED again comprehensive metabolic profile done had a sodium of 120 chloride of 87 glucose of 135 otherwise is within normal limits. Lipase level was decreased at 20. CBC had a platelet count of 147 otherwise was within normal limits. Urinalysis done the night prior to admission the ED was leukocytes negative nitrite positive. Urine cultures pending. CT abdomen and pelvis was also obtained with no acute abnormalities. [facility] for further evaluation and management secondary to her hyponatremia.   Hospital Course:  #1 hyponatremia Patient was admitted with a hyponatremia with a sodium of 120. It was felt patient's hyponatremia was secondary to hypovolemic hyponatremia as patient looked clinically dehydrated on physical exam and not had some decreased oral intake recently. TSH was checked which was within normal limits. Cortisol level was checked which was within normal limits. Chest x-Jailene was done which was negative for any acute abnormalities. Patient was placed on IV fluid with resolution of her hyponatremia. On day of discharge patient's sodium was up to 137. Outpatient follow-up.  #2 urinary tract infection Patient was initially diagnosed with a UTI one week prior to admission at PCPs office and placed on Bactrim however with no significant improvement patient subsequently presented to the ED. A urinalysis and cultures were drawn in the ED on the night prior to admission and patient was initially discharged from the ED with Keflex. Patient however return back to the ED with no improvement however had not filled out the Keflex. Patient was admitted placed on IV fluids and patient started empirically on IV Rocephin. Records were obtained from PCPs office however urine cultures  were not drawn during that visit. Patient had been on a few days of antibiotics and a such urine cultures drawn in the ED came back negative. Patient improved clinically  remained afebrile and patient be discharged home on 6 days of oral Ceftin to complete a one-week course of antibiotic therapy. Outpatient follow-up.  #3 dehydration Patient was hydrated with IV fluids and was euvolemic by day of discharge.  #4 hyperlipidemia Patient was maintained on home regimen of Crestor.  The rest of patient's chronic medical issues are Ms. stable throughout the hospitalization the patient be discharged in stable and improved condition.  Procedures:  CT abdomen and pelvis 06/06/2015  Chest x-Timberlee 06/06/2015  Consultations:  None  Discharge Exam: Filed Vitals:   06/07/15 1400  BP: 169/83  Pulse: 75  Temp: 97.7 F (36.5 C)  Resp: 20    General: NAD Cardiovascular: RRR Respiratory: CTAB  Discharge Instructions   Discharge Instructions    Diet general    Complete by:  As directed      Discharge instructions    Complete by:  As directed   Follow up with ORR,RICHARD L, MD in 1 week.     Increase activity slowly    Complete by:  As directed           Current Discharge Medication List    START taking these medications   Details  cefUROXime (CEFTIN) 500 MG tablet Take 1 tablet (500 mg total) by mouth 2 (two) times daily with a meal. Take for 6 days then stop. Qty: 12 tablet, Refills: 0    oxybutynin (DITROPAN-XL) 10 MG 24 hr tablet Take 1 tablet (10 mg total) by mouth at bedtime. Qty: 30 tablet, Refills: 0      CONTINUE these medications which have NOT CHANGED   Details  acetaminophen (TYLENOL) 500 MG tablet Take 1,000 mg by mouth every 6 (six) hours as needed for moderate pain.    aspirin EC 81 MG tablet Take 81 mg by mouth daily.    BIOTIN PO Take 1 tablet by mouth daily.    Calcium Carbonate-Vitamin D (CALTRATE 600+D PO) Take 1 tablet by mouth daily.    cholecalciferol (VITAMIN D) 1000 UNITS tablet Take 1,000 Units by mouth daily.    cycloSPORINE (RESTASIS) 0.05 % ophthalmic emulsion Place 1 drop into both eyes 2 (two) times daily.     docusate sodium (COLACE) 100 MG capsule Take 100 mg by mouth daily.    ibuprofen (ADVIL,MOTRIN) 200 MG tablet Take 400 mg by mouth every 6 (six) hours as needed for moderate pain.    lactobacillus acidophilus (BACID) TABS tablet Take 1 tablet by mouth daily.    Magnesium 250 MG TABS Take 250 mg by mouth daily.    Multiple Vitamin (MULTIVITAMIN WITH MINERALS) TABS tablet Take 1 tablet by mouth daily.    rosuvastatin (CRESTOR) 10 MG tablet Take 10 mg by mouth every Friday.      STOP taking these medications     Phenazopyridine HCl (AZO TABS PO)      cephALEXin (KEFLEX) 500 MG capsule      phenazopyridine (PYRIDIUM) 200 MG tablet        Allergies  Allergen Reactions  . Penicillins Rash    Has patient had a PCN reaction causing immediate rash, facial/tongue/throat swelling, SOB or lightheadedness with hypotension: No Has patient had a PCN reaction causing severe rash involving mucus membranes or skin necrosis: No Has patient had a PCN reaction that required hospitalization No Has  patient had a PCN reaction occurring within the last 10 years: No If all of the above answers are "NO", then may proceed with Cephalosporin use.   Follow-up Information    Follow up with Dan Maker, MD. Schedule an appointment as soon as possible for a visit in 1 week.   Specialty:  Internal Medicine   Contact information:   [address] [place] [address] [phone]        The results of significant diagnostics from this hospitalization (including imaging, microbiology, ancillary and laboratory) are listed below for reference.    Significant Diagnostic Studies: Ct Abdomen Pelvis Wo Contrast  06/06/2015   CLINICAL DATA:  Bladder pain continues. Recent treatment for UTI without improvement.  EXAM: CT ABDOMEN AND PELVIS WITHOUT CONTRAST  TECHNIQUE: Multidetector CT imaging of the abdomen and pelvis was performed following the standard protocol without IV contrast.   COMPARISON:  None.  FINDINGS: Lower chest:  No acute findings.  Hepatobiliary: No mass visualized on this un-enhanced exam.  Pancreas: Calcifications within the pancreatic body may reflect changes of chronic pancreatitis. No active inflammation or focal abnormality.  Spleen: Within normal limits in size.  Adrenals/Urinary Tract: No evidence of urolithiasis or hydronephrosis. No definite mass visualized on this un-enhanced exam. Urinary bladder is grossly unremarkable.  Stomach/Bowel: No evidence of obstruction, inflammatory process, or abnormal fluid collections.  Vascular/Lymphatic: Calcifications in the aorta and iliac vessels without aneurysm. No adenopathy.  Reproductive: No mass or other significant abnormality.  Other: No free fluid or free air.  Musculoskeletal:  Degenerative changes throughout the lumbar spine.  IMPRESSION: No renal or ureteral stones.  No hydronephrosis.  No acute findings.  Calcifications within the pancreatic body may reflect changes from chronic pancreatitis.  No acute findings.  Aortoiliac atherosclerosis.   Electronically Signed   By: Charlett Nose M.D.   On: 06/06/2015 09:07   X-Arelene Chest Pa And Lateral  06/06/2015   CLINICAL DATA:  74 year old female with a history of hypo in a tree me a. Cough.  EXAM: CHEST - 2 VIEW  COMPARISON:  None.  FINDINGS: Cardiomediastinal silhouette projects within normal limits in size and contour.  Stigmata of emphysema, with increased retrosternal airspace, flattened hemidiaphragms, increased AP diameter, and hyperinflation on the AP view. No confluent airspace disease, pneumothorax, or pleural effusion.  No displaced fracture.  Unremarkable appearance of the upper abdomen.  IMPRESSION: Emphysema with no evidence of acute cardiopulmonary disease.  Signed,  Yvone Neu. Loreta Ave, DO  [facility]  [facility]   Electronically Signed   By: Gilmer Mor D.O.   On: 06/06/2015 17:00    Microbiology: Recent  Results (from the past 240 hour(s))  Urine culture     Status: None   Collection Time: 06/05/15  8:55 PM  Result Value Ref Range Status   Specimen Description URINE, CLEAN CATCH  Final   Special Requests NONE  Final   Culture   Final    NO GROWTH 1 DAY Performed at [facility]    Report Status 06/07/2015 FINAL  Final     Labs: Basic Metabolic Panel:  Recent Labs Lab 06/06/15 0853 06/06/15 1621 06/07/15 0515  NA 120* 130* 137  K 3.7 4.0 3.8  CL 87* 96* 109  CO2 GLUCOSE 135* 120* 101*  BUN CREATININE 0.70 0.68 0.70  CALCIUM 9.4 9.9 9.1  MG  --  2.0  --    Liver  Function Tests:  Recent Labs Lab 06/06/15 0853  AST 30  ALT 16  ALKPHOS 63  BILITOT 1.0  PROT 6.7  ALBUMIN 4.2    Recent Labs Lab 06/06/15 0853  LIPASE 20*   No results for input(s): AMMONIA in the last 168 hours. CBC:  Recent Labs Lab 06/06/15 0853 06/07/15 0515  WBC 5.1 4.7  HGB 12.6 12.1  HCT 37.3 37.3  MCV 85.9 89.2  PLT 147* 160   Cardiac Enzymes: No results for input(s): CKTOTAL, CKMB, CKMBINDEX, TROPONINI in the last 168 hours. BNP: BNP (last 3 results) No results for input(s): BNP in the last 8760 hours.  ProBNP (last 3 results) No results for input(s): PROBNP in the last 8760 hours.  CBG: No results for input(s): GLUCAP in the last 168 hours.     SignedRamiro Harvest MD [facility] 06/07/2015, 4:53 PM

## 2015-06-07 NOTE — Evaluation (Signed)
Occupational Therapy Evaluation Patient Details Name: Toyoko Silos MRN: [id number] DOB: 1941/01/11 Today's Date: 07-07-2015    History of Present Illness 74 yo female admitted with hyponatremia, abd pain, dysuria.    Clinical Impression   No further OT needs    Follow Up Recommendations  No OT follow up    Equipment Recommendations  None recommended by OT    Recommendations for Other Services       Precautions / Restrictions Precautions Precautions: None Restrictions Weight Bearing Restrictions: No      Mobility Bed Mobility Overal bed mobility: Modified Independent                Transfers Overall transfer level: Modified independent                    Balance Overall balance assessment: No apparent balance deficits (not formally assessed)                                          ADL Overall ADL's : At baseline                                             Vision     Perception     Praxis      Pertinent Vitals/Pain Pain Assessment: No/denies pain     Hand Dominance     Extremity/Trunk Assessment Upper Extremity Assessment Upper Extremity Assessment: Overall WFL for tasks assessed   Lower Extremity Assessment Lower Extremity Assessment: Overall WFL for tasks assessed   Cervical / Trunk Assessment Cervical / Trunk Assessment: Kyphotic   Communication Communication Communication: No difficulties   Cognition Arousal/Alertness: Awake/alert Behavior During Therapy: WFL for tasks assessed/performed Overall Cognitive Status: Within Functional Limits for tasks assessed                     General Comments       Exercises       Shoulder Instructions      Home Living Family/patient expects to be discharged to:: Private residence Living Arrangements: Spouse/significant other   Type of Home: House Home Access: Stairs to enter [employer] of Steps: 1         Bathroom  Shower/Tub: Tub/shower unit;Walk-in shower   Bathroom Toilet: Handicapped height     Home Equipment: None          Prior Functioning/Environment Level of Independence: Independent             OT Diagnosis:     OT Problem List:     OT Treatment/Interventions:      OT Goals(Current goals can be found in the care plan section) Acute Rehab OT Goals Patient Stated Goal: home soon  OT Frequency:     Barriers to D/C:            Co-evaluation              End of Session    Activity Tolerance: Patient tolerated treatment well Patient left: in bed;with call bell/phone within reach;with family/visitor present   Time: 2725-3664 OT Time Calculation (min): 11 min Charges:  OT General Charges $OT Visit: 1 Procedure OT Evaluation $Initial OT Evaluation Tier I: 1 Procedure G-Codes:    Alba Cory 07/07/15, 12:41 PM

## 2015-06-07 NOTE — Evaluation (Signed)
Physical Therapy Evaluation-1x Patient Details Name: Kristine Hernandez MRN: [id number] DOB: 1941-02-03 Today's Date: 06/07/2015   History of Present Illness  74 yo female admitted with hyponatremia, abd pain, dysuria.   Clinical Impression  On eval, pt was supervision level assist for mobility-walked ~400 feet. Intermittent stumbles but no LOB. Pt tends to move quickly at times. No acute PT needs. 1x eval. Will sign off.     Follow Up Recommendations No PT follow up    Equipment Recommendations  None recommended by PT    Recommendations for Other Services       Precautions / Restrictions Precautions Precautions: None Restrictions Weight Bearing Restrictions: No      Mobility  Bed Mobility Overal bed mobility: Modified Independent                Transfers Overall transfer level: Modified independent                  Ambulation/Gait Ambulation/Gait assistance: Supervision Ambulation Distance (Feet): 400 Feet Assistive device: None Gait Pattern/deviations: Step-through pattern        Stairs            Wheelchair Mobility    Modified Rankin (Stroke Patients Only)       Balance Overall balance assessment: No apparent balance deficits (not formally assessed)                                           Pertinent Vitals/Pain Pain Assessment: No/denies pain    Home Living Family/patient expects to be discharged to:: Private residence Living Arrangements: Spouse/significant other   Type of Home: House Home Access: Stairs to enter   Secretary/administrator of Steps: 1   Home Equipment: None      Prior Function Level of Independence: Independent               Hand Dominance        Extremity/Trunk Assessment   Upper Extremity Assessment: Defer to OT evaluation           Lower Extremity Assessment: Overall WFL for tasks assessed      Cervical / Trunk Assessment: Kyphotic  Communication   Communication: No  difficulties  Cognition Arousal/Alertness: Awake/alert Behavior During Therapy: WFL for tasks assessed/performed Overall Cognitive Status: Within Functional Limits for tasks assessed                      General Comments      Exercises        Assessment/Plan    PT Assessment Patent does not need any further PT services  PT Diagnosis     PT Problem List    PT Treatment Interventions     PT Goals (Current goals can be found in the Care Plan section) Acute Rehab PT Goals Patient Stated Goal: home soon PT Goal Formulation: All assessment and education complete, DC therapy    Frequency     Barriers to discharge        Co-evaluation               End of Session   Activity Tolerance: Patient tolerated treatment well Patient left: in chair;with call bell/phone within reach;with family/visitor present           Time: 4098-1191 PT Time Calculation (min) (ACUTE ONLY): 11 min   Charges:   PT Evaluation $Initial PT  Evaluation Tier I: 1 Procedure     PT G Codes:        Rebeca Alert, MPT Pager: [phone]

## 2015-08-30 NOTE — Progress Notes (Signed)
Late entry for missed G-code. Based on review of the evaluation and goals by Rebeca AlertJannie Porter, PT.  Pt at S level of function.    06/07/15 1044  PT G-Codes **NOT FOR INPATIENT CLASS**  Functional Assessment Tool Used clinical judgement based on review of medical record  Functional Limitation Mobility: Walking and moving around  Mobility: Walking and Moving Around Current Status ([phone](G8978) CI  Mobility: Walking and Moving Around Goal Status [phone]([phone]) CI  Mobility: Walking and Moving Around Discharge Status [phone]([phone]) CI  Lavona MoundMark Sawulski, [place]  [phone] 08/30/2015

## 2016-12-07 IMAGING — CT CT ABD-PELV W/O CM
2 of 4 series · 16 of 46 positions shown, 18 images · non-contrast
Comparison: None.

CLINICAL DATA: Bladder pain continues. Recent treatment for UTI
without improvement.

EXAM:
CT ABDOMEN AND PELVIS WITHOUT CONTRAST
TECHNIQUE: Multidetector CT imaging of the abdomen and pelvis was performed
following the standard protocol without IV contrast.

[Series 2: abd/pelvis 5.0 b31f · axial · 0.75mm/px · z∈[-471,-71]mm · 13 of 88 slices shown, 15 images]
[im 4/88  soft-tissue]
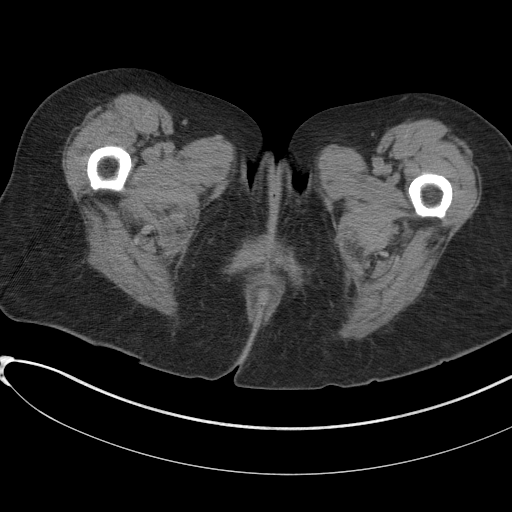
[im 4/88  bone]
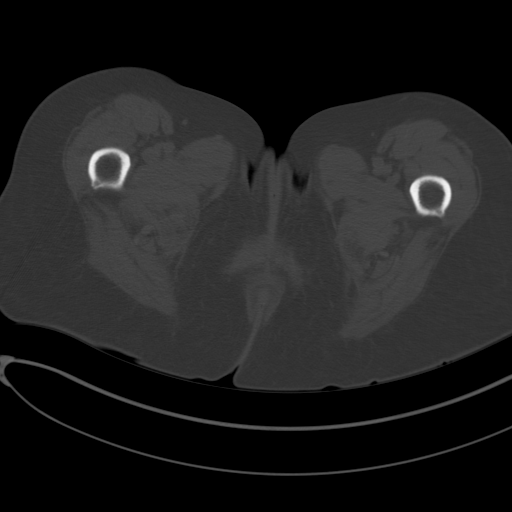
[im 11/88  soft-tissue]
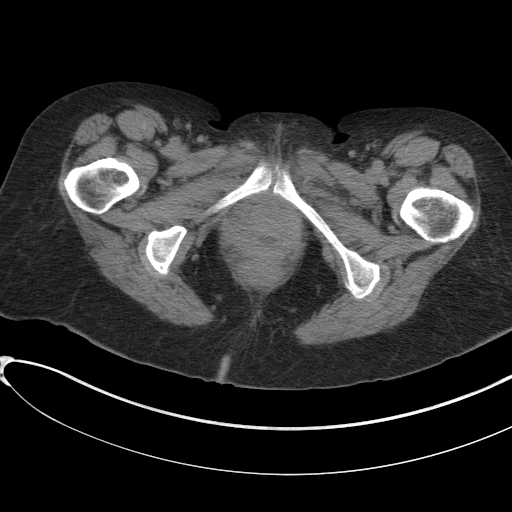
[im 18/88  soft-tissue]
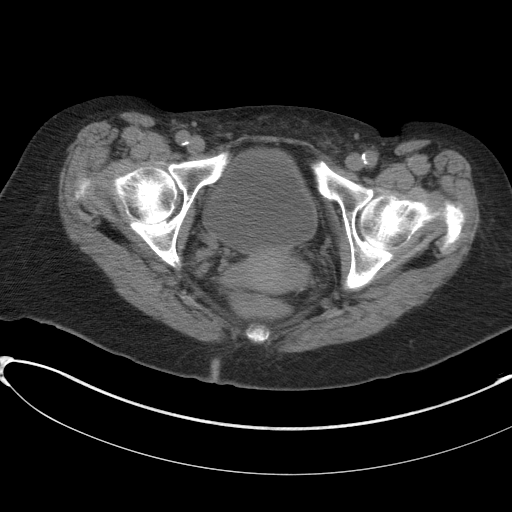
[im 25/88  soft-tissue]
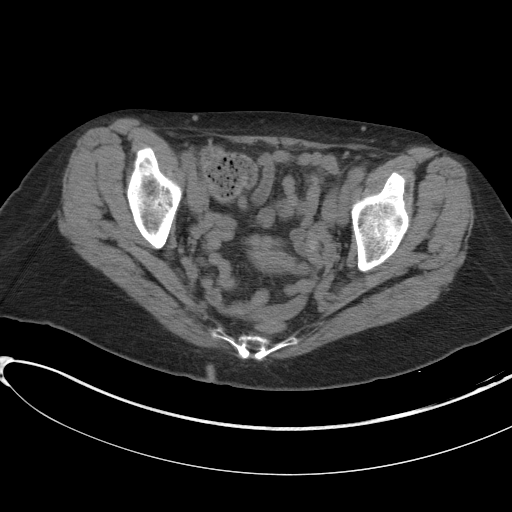
[im 32/88  soft-tissue]
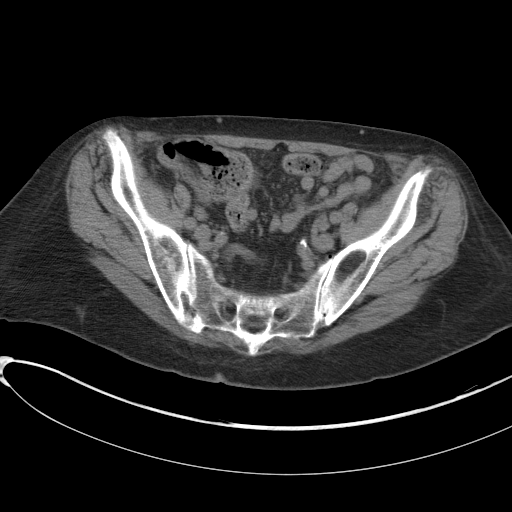
[im 39/88  soft-tissue]
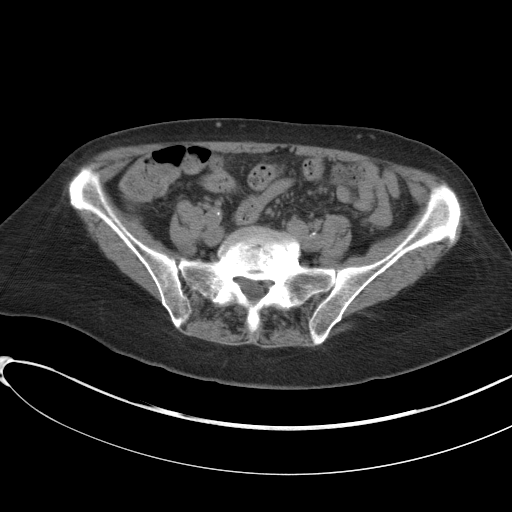
[im 46/88  soft-tissue]
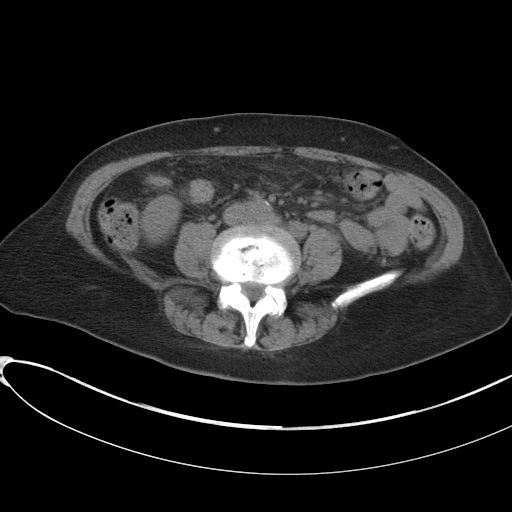
[im 49/88  soft-tissue]
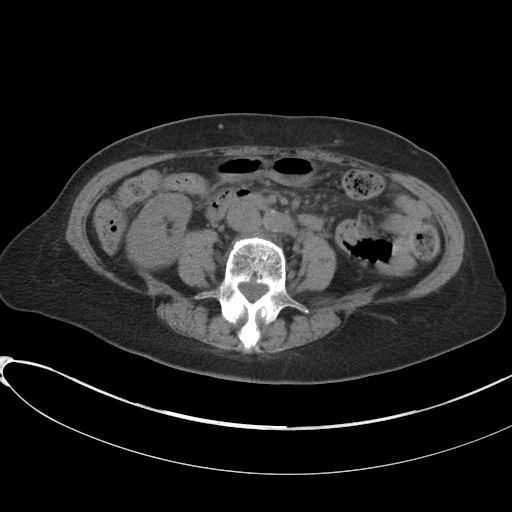
[im 56/88  soft-tissue]
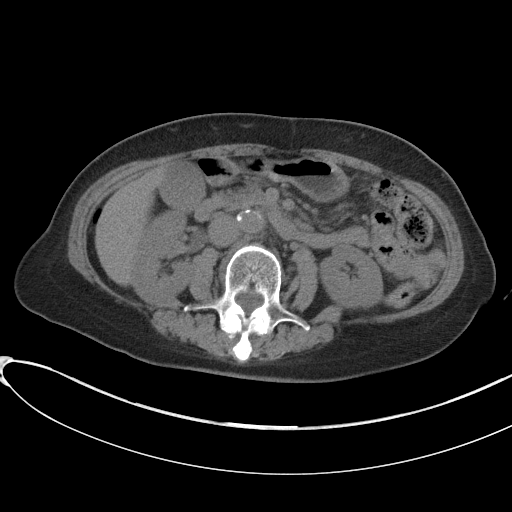
[im 56/88  bone]
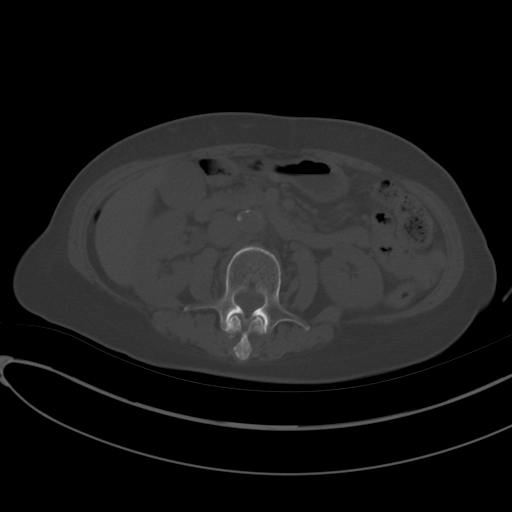
[im 63/88  soft-tissue]
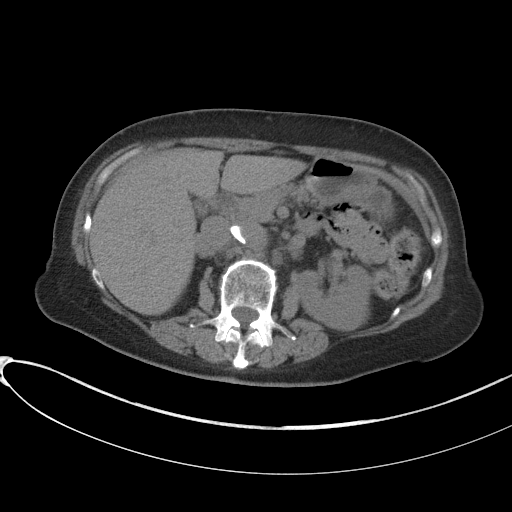
[im 70/88  soft-tissue]
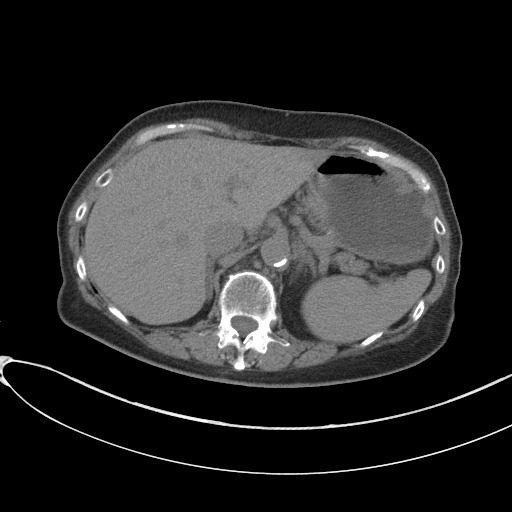
[im 77/88  soft-tissue]
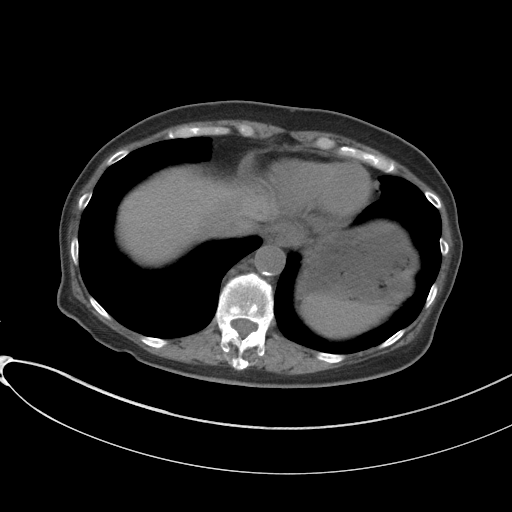
[im 84/88  soft-tissue]
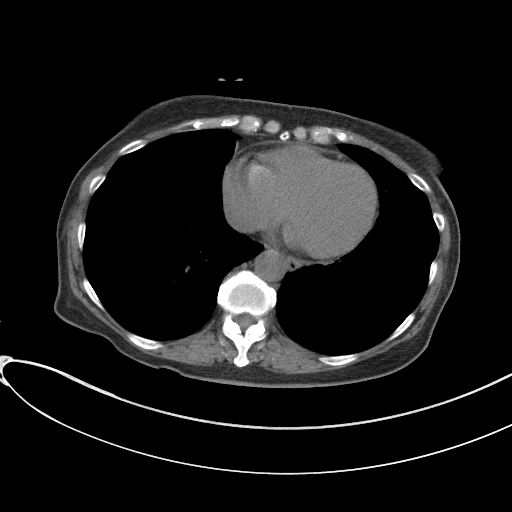

[Series 5: abd/pelvis 3.0 coronal · coronal · 0.69mm/px · 3 of 62 slices shown]
[im 21/62  soft-tissue]
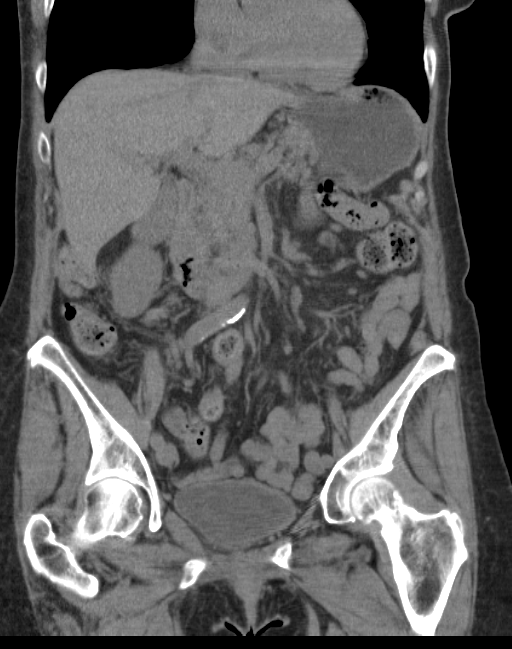
[im 28/62  soft-tissue]
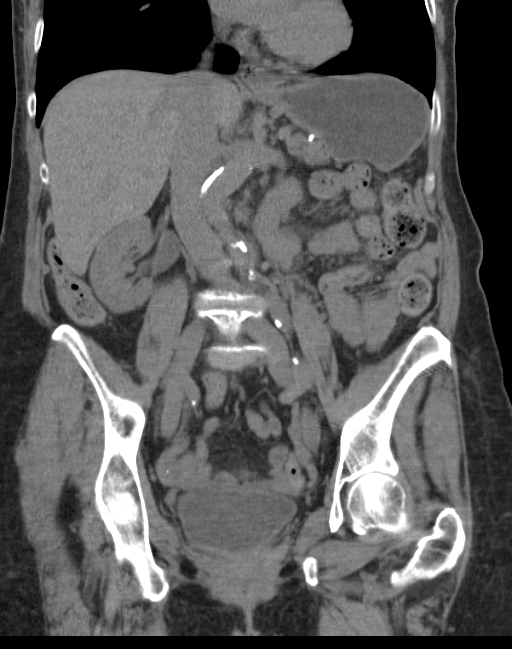
[im 34/62  soft-tissue]
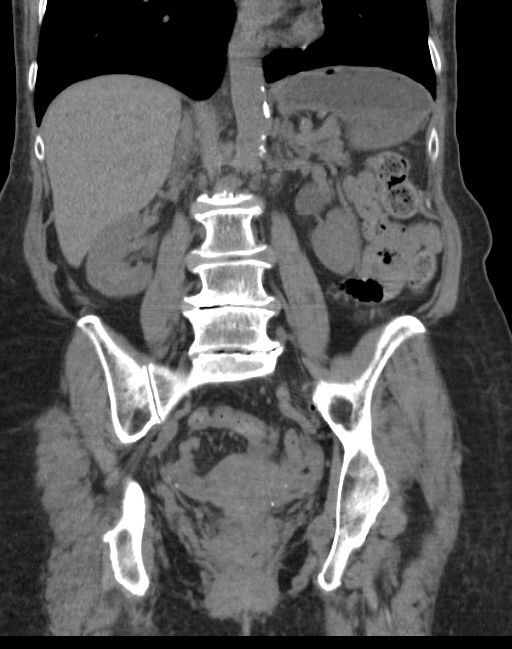

[16 of 46 positions shown; findings below may reference images not displayed]

FINDINGS: Lower chest:  No acute findings.

Hepatobiliary: No mass visualized on this un-enhanced exam.

Pancreas: Calcifications within the pancreatic body may reflect
changes of chronic pancreatitis. No active inflammation or focal
abnormality.

Spleen: Within normal limits in size.

Adrenals/Urinary Tract: No evidence of urolithiasis or
hydronephrosis. No definite mass visualized on this un-enhanced
exam. Urinary bladder is grossly unremarkable.

Stomach/Bowel: No evidence of obstruction, inflammatory process, or
abnormal fluid collections.

Vascular/Lymphatic: Calcifications in the aorta and iliac vessels
without aneurysm. No adenopathy.

Reproductive: No mass or other significant abnormality.

Other: No free fluid or free air.

Musculoskeletal:  Degenerative changes throughout the lumbar spine.
IMPRESSION: No renal or ureteral stones.  No hydronephrosis.  No acute findings.

Calcifications within the pancreatic body may reflect changes from
chronic pancreatitis.

No acute findings.

Aortoiliac atherosclerosis.

## 2017-06-17 ENCOUNTER — Emergency Department (HOSPITAL_BASED_OUTPATIENT_CLINIC_OR_DEPARTMENT_OTHER)
Admission: EM | Admit: 2017-06-17 | Discharge: 2017-06-17 | Disposition: A | Discharge status: Home / Self Care | Payer: Medicare Other | Attending: Emergency Medicine | Admitting: Emergency Medicine

## 2017-06-17 ENCOUNTER — Encounter (HOSPITAL_BASED_OUTPATIENT_CLINIC_OR_DEPARTMENT_OTHER): Payer: Self-pay | Admitting: Emergency Medicine

## 2017-06-17 DIAGNOSIS — Z79899 Other long term (current) drug therapy: Secondary | ICD-10-CM | POA: Diagnosis not present

## 2017-06-17 DIAGNOSIS — R1030 Lower abdominal pain, unspecified: Secondary | ICD-10-CM | POA: Diagnosis not present

## 2017-06-17 DIAGNOSIS — Z7982 Long term (current) use of aspirin: Secondary | ICD-10-CM | POA: Diagnosis not present

## 2017-06-17 DIAGNOSIS — N390 Urinary tract infection, site not specified: Secondary | ICD-10-CM | POA: Diagnosis not present

## 2017-06-17 DIAGNOSIS — I10 Essential (primary) hypertension: Secondary | ICD-10-CM | POA: Insufficient documentation

## 2017-06-17 DIAGNOSIS — R300 Dysuria: Secondary | ICD-10-CM | POA: Diagnosis present

## 2017-06-17 HISTORY — DX: Essential (primary) hypertension: I10

## 2017-06-17 LAB — URINALYSIS, ROUTINE W REFLEX MICROSCOPIC
Bilirubin Urine: NEGATIVE
Glucose, UA: NEGATIVE mg/dL
Ketones, ur: 15 mg/dL — AB
NITRITE: NEGATIVE
Protein, ur: NEGATIVE mg/dL
SPECIFIC GRAVITY, URINE: 1.02 (ref 1.005–1.030)
pH: 6 (ref 5.0–8.0)

## 2017-06-17 LAB — URINALYSIS, MICROSCOPIC (REFLEX)

## 2017-06-17 MED ORDER — PHENAZOPYRIDINE HCL 100 MG PO TABS
200.0000 mg | ORAL_TABLET | Freq: Once | ORAL | Status: AC
  Administered 2017-06-17: 200 mg via ORAL
  Filled 2017-06-17: qty 2

## 2017-06-17 MED ORDER — CEPHALEXIN 500 MG PO CAPS: 500.0000 mg | ORAL_CAPSULE | Freq: Three times a day (TID) | ORAL | 0 refills | Status: DC

## 2017-06-17 MED ORDER — CEPHALEXIN 250 MG PO CAPS
500.0000 mg | ORAL_CAPSULE | Freq: Once | ORAL | Status: AC
  Administered 2017-06-17: 500 mg via ORAL
  Filled 2017-06-17: qty 2

## 2017-06-17 NOTE — ED Provider Notes (Signed)
[facility] Provider Note   CSN: [id number] Arrival date & time: 06/17/17  1903     History   Chief Complaint Chief Complaint  Patient presents with  . Dysuria    HPI Kristine Hernandez is a 76 y.o. female.  Patient c/o dysuria, and mild urgency in the past day. Mild suprapubic discomfort. No severe abdominal pain. No back or flank pain. No nausea or vomiting. Normal appetite. No fever or chills.   The history is provided by the patient.  Dysuria   Pertinent negatives include no chills, no nausea, no vomiting and no flank pain.    Past Medical History:  Diagnosis Date  . Hyperchloremia   . Hyperlipidemia 06/06/2015  . Hypertension     Patient Active Problem List   Diagnosis Date Noted  . Hyponatremia 06/06/2015  . UTI (urinary tract infection) 06/06/2015  . Hyperlipidemia 06/06/2015  . Dehydration 06/06/2015    Past Surgical History:  Procedure Laterality Date  . BLADDER SURGERY      OB History    No data available       Home Medications    Prior to Admission medications   Medication Sig Start Date End Date Taking? Authorizing Provider  aspirin EC 81 MG tablet Take 81 mg by mouth daily.   Yes [provider]  benazepril (LOTENSIN) 20 MG tablet Take 20 mg by mouth daily.   Yes [provider]  rosuvastatin (CRESTOR) 10 MG tablet Take 10 mg by mouth every Friday.   Yes [provider]  acetaminophen (TYLENOL) 500 MG tablet Take 1,000 mg by mouth every 6 (six) hours as needed for moderate pain.    [provider]  BIOTIN PO Take 1 tablet by mouth daily.    [provider]  Calcium Carbonate-Vitamin D (CALTRATE 600+D PO) Take 1 tablet by mouth daily.    [provider]  cefUROXime (CEFTIN) 500 MG tablet Take 1 tablet (500 mg total) by mouth 2 (two) times daily with a meal. Take for 6 days then stop. 06/07/15   Rodolph Bong, MD  cholecalciferol (VITAMIN D) 1000 UNITS tablet Take 1,000 Units by  mouth daily.    [provider]  cycloSPORINE (RESTASIS) 0.05 % ophthalmic emulsion Place 1 drop into both eyes 2 (two) times daily.    [provider]  docusate sodium (COLACE) 100 MG capsule Take 100 mg by mouth daily.    [provider]  ibuprofen (ADVIL,MOTRIN) 200 MG tablet Take 400 mg by mouth every 6 (six) hours as needed for moderate pain.    [provider]  lactobacillus acidophilus (BACID) TABS tablet Take 1 tablet by mouth daily.    [provider]  Magnesium 250 MG TABS Take 250 mg by mouth daily.    [provider]  Multiple Vitamin (MULTIVITAMIN WITH MINERALS) TABS tablet Take 1 tablet by mouth daily.    [provider]  oxybutynin (DITROPAN-XL) 10 MG 24 hr tablet Take 1 tablet (10 mg total) by mouth at bedtime. 06/07/15   Rodolph Bong, MD    Family History Family History  Problem Relation Age of Onset  . Dementia Mother   . CVA Father     Social History Social History  Substance Use Topics  . Smoking status: Never Smoker  . Smokeless tobacco: Never Used  . Alcohol use Yes     Comment: SOCIAL DRINKER     Allergies   Penicillins   Review of Systems Review of Systems  Constitutional: Negative for chills and fever.  Gastrointestinal: Negative for nausea and vomiting.  Genitourinary: Positive for dysuria. Negative for flank pain.  Musculoskeletal: Negative for back pain.  Neurological: Negative for weakness.     Physical Exam Updated Vital Signs BP (!) 175/82 (BP Location: Right Arm)   Pulse 63   Temp 98.6 F (37 C) (Oral)   Resp 18   Ht 1.676 m ( )   Wt 68 kg (150 lb)   SpO2 98%   BMI 24.21 kg/m   Physical Exam  Constitutional: She appears well-developed and well-nourished. No distress.  Eyes: Conjunctivae are normal. No scleral icterus.  Neck: Neck supple. No tracheal deviation present.  Pulmonary/Chest: Effort normal. No respiratory distress.  Abdominal: Soft. Normal  appearance and bowel sounds are normal. She exhibits no distension. There is no tenderness.  Genitourinary:  Genitourinary Comments: No cva tenderness  Musculoskeletal: She exhibits no edema.  Neurological: She is alert.  Skin: Skin is warm and dry. No rash noted. She is not diaphoretic.  Psychiatric: She has a normal mood and affect.  Nursing note and vitals reviewed.    ED Treatments / Results  Labs (all labs ordered are listed, but only abnormal results are displayed) Results for orders placed or performed during the hospital encounter of 06/17/17  Urinalysis, Routine w reflex microscopic  Result Value Ref Range   Color, Urine YELLOW YELLOW   APPearance CLEAR CLEAR   Specific Gravity, Urine 1.020 1.005 - 1.030   pH 6.0 5.0 - 8.0   Glucose, UA NEGATIVE NEGATIVE mg/dL   Hgb urine dipstick TRACE (A) NEGATIVE   Bilirubin Urine NEGATIVE NEGATIVE   Ketones, ur 15 (A) NEGATIVE mg/dL   Protein, ur NEGATIVE NEGATIVE mg/dL   Nitrite NEGATIVE NEGATIVE   Leukocytes, UA SMALL (A) NEGATIVE  Urinalysis, Microscopic (reflex)  Result Value Ref Range   RBC / HPF 0-5 0 - 5 RBC/hpf   WBC, UA 6-30 0 - 5 WBC/hpf   Bacteria, UA FEW (A) NONE SEEN   Squamous Epithelial / LPF 0-5 (A) NONE SEEN   Mucus PRESENT     EKG  EKG Interpretation None       Radiology No results found.  Procedures Procedures (including critical care time)  Medications Ordered in ED Medications  cephALEXin (KEFLEX) capsule 500 mg (500 mg Oral Given 06/17/17 2105)  phenazopyridine (PYRIDIUM) tablet 200 mg (200 mg Oral Given 06/17/17 2106)     Initial Impression / Assessment and Plan / ED Course  I have reviewed the triage vital signs and the nursing notes.  Pertinent labs & imaging results that were available during my care of the patient were reviewed by me and considered in my medical decision making (see chart for details).  Pt with urinary symptoms. 6-30 wbc and bacteria on ua.  Will tx.  Keflex po.  Pyridium po.  rx for home.    Final Clinical Impressions(s) / ED Diagnoses   Final diagnoses:  None    New Prescriptions New Prescriptions   No medications on file     Cathren Laine, MD 06/17/17 2110

## 2017-06-17 NOTE — ED Notes (Signed)
Pt states she went to the restroom earlier and had some burning with urination. Later felt the urge to go, but did not make it to the restroom. Denies other s/s.

## 2017-06-17 NOTE — ED Triage Notes (Signed)
Dysuria and frequency today.

## 2017-06-17 NOTE — Discharge Instructions (Signed)
It was our pleasure to provide your ER care today - we hope that you feel better.  Rest. Drink plenty of fluids.  Take keflex (antibiotic as prescribed).  Take tylenol/advil as need.  Your blood pressure is high today - follow up with primary care doctor in 1-2 weeks.   Follow up with primary care doctor in 1 week if symptoms fail to improve/resolve.  Return to ER if worse, other concern.

## 2017-06-17 NOTE — ED Notes (Signed)
Pt given d/c instructions as per chart. Rx x 1. Verbalizes understanding. No questions. 

## 2018-02-19 ENCOUNTER — Other Ambulatory Visit: Payer: Self-pay

## 2018-02-19 ENCOUNTER — Encounter (HOSPITAL_BASED_OUTPATIENT_CLINIC_OR_DEPARTMENT_OTHER): Payer: Self-pay

## 2018-02-19 ENCOUNTER — Emergency Department (HOSPITAL_BASED_OUTPATIENT_CLINIC_OR_DEPARTMENT_OTHER)
Admission: EM | Admit: 2018-02-19 | Discharge: 2018-02-19 | Disposition: A | Discharge status: Home / Self Care | Payer: Medicare Other | Attending: Emergency Medicine | Admitting: Emergency Medicine

## 2018-02-19 DIAGNOSIS — R112 Nausea with vomiting, unspecified: Secondary | ICD-10-CM | POA: Insufficient documentation

## 2018-02-19 DIAGNOSIS — E860 Dehydration: Secondary | ICD-10-CM

## 2018-02-19 DIAGNOSIS — R42 Dizziness and giddiness: Secondary | ICD-10-CM | POA: Diagnosis not present

## 2018-02-19 DIAGNOSIS — Z7982 Long term (current) use of aspirin: Secondary | ICD-10-CM | POA: Diagnosis not present

## 2018-02-19 DIAGNOSIS — R197 Diarrhea, unspecified: Secondary | ICD-10-CM

## 2018-02-19 DIAGNOSIS — Z79899 Other long term (current) drug therapy: Secondary | ICD-10-CM | POA: Diagnosis not present

## 2018-02-19 DIAGNOSIS — I10 Essential (primary) hypertension: Secondary | ICD-10-CM | POA: Diagnosis not present

## 2018-02-19 LAB — COMPREHENSIVE METABOLIC PANEL
ALBUMIN: 4.3 g/dL (ref 3.5–5.0)
ALT: 12 U/L — ABNORMAL LOW (ref 14–54)
ANION GAP: 9 (ref 5–15)
AST: 25 U/L (ref 15–41)
Alkaline Phosphatase: 51 U/L (ref 38–126)
BUN: 21 mg/dL — ABNORMAL HIGH (ref 6–20)
CHLORIDE: 94 mmol/L — AB (ref 101–111)
CO2: 24 mmol/L (ref 22–32)
Calcium: 9.7 mg/dL (ref 8.9–10.3)
Creatinine, Ser: 0.87 mg/dL (ref 0.44–1.00)
GFR calc non Af Amer: >60 mL/min (ref >60)
GLUCOSE: 118 mg/dL — AB (ref 65–99)
POTASSIUM: 3.6 mmol/L (ref 3.5–5.1)
Sodium: 127 mmol/L — ABNORMAL LOW (ref 135–145)
Total Bilirubin: 1.2 mg/dL (ref 0.3–1.2)
Total Protein: 6.9 g/dL (ref 6.5–8.1)

## 2018-02-19 LAB — URINALYSIS, ROUTINE W REFLEX MICROSCOPIC
BILIRUBIN URINE: NEGATIVE
GLUCOSE, UA: NEGATIVE mg/dL
Ketones, ur: 40 mg/dL — AB
Leukocytes, UA: NEGATIVE
NITRITE: NEGATIVE
PH: 5.5 (ref 5.0–8.0)
Protein, ur: NEGATIVE mg/dL
Specific Gravity, Urine: 1.025 (ref 1.005–1.030)

## 2018-02-19 LAB — CBC WITH DIFFERENTIAL/PLATELET
BASOS ABS: 0 10*3/uL (ref 0.0–0.1)
Basophils Relative: 0 %
EOS ABS: 0 10*3/uL (ref 0.0–0.7)
Eosinophils Relative: 0 %
HCT: 38.5 % (ref 36.0–46.0)
Hemoglobin: 13.3 g/dL (ref 12.0–15.0)
Lymphocytes Relative: 20 %
Lymphs Abs: 1.9 10*3/uL (ref 0.7–4.0)
MCH: 30.2 pg (ref 26.0–34.0)
MCHC: 34.5 g/dL (ref 30.0–36.0)
MCV: 87.3 fL (ref 78.0–100.0)
MONO ABS: 0.5 10*3/uL (ref 0.1–1.0)
MONOS PCT: 6 %
NEUTROS PCT: 74 %
Neutro Abs: 6.7 10*3/uL (ref 1.7–7.7)
Platelets: 138 10*3/uL — ABNORMAL LOW (ref 150–400)
RBC: 4.41 MIL/uL (ref 3.87–5.11)
RDW: 12.7 % (ref 11.5–15.5)
WBC: 9.1 10*3/uL (ref 4.0–10.5)

## 2018-02-19 LAB — URINALYSIS, MICROSCOPIC (REFLEX): WBC, UA: NONE SEEN WBC/hpf (ref 0–5)

## 2018-02-19 LAB — LIPASE, BLOOD: LIPASE: 23 U/L (ref 11–51)

## 2018-02-19 MED ORDER — ONDANSETRON HCL 4 MG PO TABS: 4.0000 mg | ORAL_TABLET | Freq: Three times a day (TID) | ORAL | 0 refills | Status: AC | PRN

## 2018-02-19 MED ORDER — SODIUM CHLORIDE 0.9 % IV BOLUS
1000.0000 mL | Freq: Once | INTRAVENOUS | Status: AC
  Administered 2018-02-19: 1000 mL via INTRAVENOUS

## 2018-02-19 MED ORDER — ONDANSETRON HCL 4 MG/2ML IJ SOLN
4.0000 mg | Freq: Once | INTRAMUSCULAR | Status: AC
  Administered 2018-02-19: 4 mg via INTRAVENOUS
  Filled 2018-02-19: qty 2

## 2018-02-19 NOTE — ED Notes (Addendum)
Given gingerale and saltine crackers, husband at bedside

## 2018-02-19 NOTE — Discharge Instructions (Signed)
Your work-up today showed low sodium however it is not as bad as it was previously.  I suspect this is secondary to your nausea, vomiting, and diarrhea.  We repleted your fluids tonight and you felt better.  We feel you are safe for discharge home given your reassuring vital signs and exam.  Please follow-up with your primary doctor in several days.  If any symptoms change or worsen, please return to the nearest emergency department.

## 2018-02-19 NOTE — ED Provider Notes (Signed)
[facility] EMERGENCY DEPARTMENT Provider Note   CSN: [id number] Arrival date & time: 02/19/18  1931     History   Chief Complaint Chief Complaint  Patient presents with  . Diarrhea    HPI Kristine Hernandez is a 77 y.o. female.  The history is provided by the patient, the spouse and medical records.  Diarrhea   This is a new problem. The current episode started yesterday. The problem occurs 2 to 4 times per day. The problem has been gradually improving. The stool consistency is described as watery. There has been no fever. Associated symptoms include vomiting. Pertinent negatives include no abdominal pain, no chills, no sweats and no headaches. She has tried nothing for the symptoms. The treatment provided no relief.    Past Medical History:  Diagnosis Date  . Hyperchloremia   . Hyperlipidemia 06/06/2015  . Hypertension     Patient Active Problem List   Diagnosis Date Noted  . Hyponatremia 06/06/2015  . UTI (urinary tract infection) 06/06/2015  . Hyperlipidemia 06/06/2015  . Dehydration 06/06/2015    Past Surgical History:  Procedure Laterality Date  . BLADDER SURGERY       OB History   None      Home Medications    Prior to Admission medications   Medication Sig Start Date End Date Taking? Authorizing Provider  acetaminophen (TYLENOL) 500 MG tablet Take 1,000 mg by mouth every 6 (six) hours as needed for moderate pain.    [provider]  aspirin EC 81 MG tablet Take 81 mg by mouth daily.    [provider]  benazepril (LOTENSIN) 20 MG tablet Take 20 mg by mouth daily.    [provider]  BIOTIN PO Take 1 tablet by mouth daily.    [provider]  Calcium Carbonate-Vitamin D (CALTRATE 600+D PO) Take 1 tablet by mouth daily.    [provider]  cefUROXime (CEFTIN) 500 MG tablet Take 1 tablet (500 mg total) by mouth 2 (two) times daily with a meal. Take for 6 days then stop. 06/07/15   Rodolph Bonghompson, Daniel V, MD    cephALEXin (KEFLEX) 500 MG capsule Take 1 capsule (500 mg total) by mouth 3 (three) times daily. 06/17/17   Cathren LaineSteinl, Kevin, MD  cholecalciferol (VITAMIN D) 1000 UNITS tablet Take 1,000 Units by mouth daily.    [provider]  cycloSPORINE (RESTASIS) 0.05 % ophthalmic emulsion Place 1 drop into both eyes 2 (two) times daily.    [provider]  docusate sodium (COLACE) 100 MG capsule Take 100 mg by mouth daily.    [provider]  ibuprofen (ADVIL,MOTRIN) 200 MG tablet Take 400 mg by mouth every 6 (six) hours as needed for moderate pain.    [provider]  lactobacillus acidophilus (BACID) TABS tablet Take 1 tablet by mouth daily.    [provider]  Magnesium 250 MG TABS Take 250 mg by mouth daily.    [provider]  Multiple Vitamin (MULTIVITAMIN WITH MINERALS) TABS tablet Take 1 tablet by mouth daily.    [provider]  oxybutynin (DITROPAN-XL) 10 MG 24 hr tablet Take 1 tablet (10 mg total) by mouth at bedtime. 06/07/15   Rodolph Bonghompson, Daniel V, MD  rosuvastatin (CRESTOR) 10 MG tablet Take 10 mg by mouth every Friday.    [provider]    Family History Family History  Problem Relation Age of Onset  . Dementia Mother   . CVA Father  Social History Social History   Tobacco Use  . Smoking status: Never Smoker  . Smokeless tobacco: Never Used  Substance Use Topics  . Alcohol use: Yes    Comment: rare  . Drug use: No     Allergies   Penicillins   Review of Systems Review of Systems  Constitutional: Positive for fatigue. Negative for chills, diaphoresis and fever.  HENT: Negative for congestion.   Eyes: Negative for visual disturbance.  Respiratory: Negative for chest tightness, shortness of breath and wheezing.   Cardiovascular: Negative for chest pain.  Gastrointestinal: Positive for diarrhea, nausea and vomiting. Negative for abdominal distention and abdominal pain.  Genitourinary: Negative for  flank pain and frequency.  Musculoskeletal: Negative for back pain, neck pain and neck stiffness.  Neurological: Positive for light-headedness. Negative for seizures, speech difficulty, weakness and headaches.  Psychiatric/Behavioral: Negative for agitation.  All other systems reviewed and are negative.    Physical Exam Updated Vital Signs BP (!) 176/73 (BP Location: Right Arm)   Pulse 70   Temp 98.4 F (36.9 C) (Oral)   Resp 14   Ht 5\' 7"  (1.702 m)   Wt 64.9 kg (143 lb)   SpO2 99%   BMI 22.40 kg/m   Physical Exam  Constitutional: She appears well-developed and well-nourished. No distress.  HENT:  Head: Normocephalic and atraumatic.  Nose: Nose normal.  Mouth/Throat: Oropharynx is clear and moist. No oropharyngeal exudate.  Eyes: Pupils are equal, round, and reactive to light. Conjunctivae and EOM are normal.  Neck: Neck supple.  Cardiovascular: Normal rate, regular rhythm and intact distal pulses.  No murmur heard. Pulmonary/Chest: Effort normal and breath sounds normal. No respiratory distress. She has no wheezes. She has no rales. She exhibits no tenderness.  Abdominal: Soft. She exhibits no distension. There is no tenderness.  Musculoskeletal: She exhibits no edema or tenderness.  Neurological: She is alert. No sensory deficit. She exhibits normal muscle tone.  Skin: Skin is warm and dry. Capillary refill takes less than 2 seconds. No rash noted. She is not diaphoretic. No erythema.  Psychiatric: She has a normal mood and affect.  Nursing note and vitals reviewed.    ED Treatments / Results  Labs (all labs ordered are listed, but only abnormal results are displayed) Labs Reviewed  URINALYSIS, ROUTINE W REFLEX MICROSCOPIC - Abnormal; Notable for the following components:      Result Value   Hgb urine dipstick TRACE (*)    Ketones, ur 40 (*)    All other components within normal limits  URINALYSIS, MICROSCOPIC (REFLEX) - Abnormal; Notable for the following  components:   Bacteria, UA RARE (*)    All other components within normal limits  CBC WITH DIFFERENTIAL/PLATELET - Abnormal; Notable for the following components:   Platelets 138 (*)    All other components within normal limits  COMPREHENSIVE METABOLIC PANEL - Abnormal; Notable for the following components:   Sodium 127 (*)    Chloride 94 (*)    Glucose, Bld 118 (*)    BUN 21 (*)    ALT 12 (*)    All other components within normal limits  LIPASE, BLOOD    EKG None  Radiology No results found.  Procedures Procedures (including critical care time)  Medications Ordered in ED Medications  ondansetron (ZOFRAN) injection 4 mg (4 mg Intravenous Given 02/19/18 2203)  sodium chloride 0.9 % bolus 1,000 mL (0 mLs Intravenous Stopped 02/19/18 2249)     Initial Impression / Assessment and Plan /  ED Course  I have reviewed the triage vital signs and the nursing notes.  Pertinent labs & imaging results that were available during my care of the patient were reviewed by me and considered in my medical decision making (see chart for details).     Karista Hendley is a 77 y.o. female with a past medical history significant for hyperlipidemia, hypertension, and prior hyponatremia who presents with nausea, vomiting, diarrhea, and lightheadedness.  Patient reports that last night she started having the GI symptoms including nausea, vomiting, diarrhea.  She reports no blood in her emesis or her bowel movement.  She says that she was unable to eat or drink as much today and was feeling lightheaded this afternoon prompting her to seek evaluation.  She denies syncope, chest pain, or palpitations.  She denies any neurologic deficits.  She denies any urinary symptoms.  She denies any other complaints.  On exam, no abdominal tenderness.  No CVA tenderness.  Lungs clear.  Patient appears well.  Clinically I am concerned about dehydration in the setting of the GI symptoms.  Suspect gastroenteritis as the cause  of symptoms versus urinary tract infection.  Patient had screening laboratory testing as well as urinalysis.  No evidence of UTI seen.  Laboratory testing was reassuring.  Patient had some hyponatremia which was not as severe as prior.  Patient given fluids with complete resolution in her lightheadedness.  Patient was feeling well and felt safe to go home.  Patient will be given prescription for nausea medicine and instructed to follow with PCP in several days for recheck of her sodium level.  Patient had no neurologic deficits on reassessment and continued to feel well.  Next  Patient was felt stable for discharge home.  Patient discharged in good condition with understanding of return precautions and follow-up instructions.          Final Clinical Impressions(s) / ED Diagnoses   Final diagnoses:  Nausea vomiting and diarrhea  Lightheaded  Dehydration    ED Discharge Orders        Ordered    ondansetron (ZOFRAN) 4 MG tablet  Every 8 hours PRN     02/19/18 2321      Clinical Impression: 1. Nausea vomiting and diarrhea   2. Lightheaded   3. Dehydration     Disposition: Discharge  Condition: Good  I have discussed the results, Dx and Tx plan with the pt(& family if present). He/she/they expressed understanding and agree(s) with the plan. Discharge instructions discussed at great length. Strict return precautions discussed and pt &/or family have verbalized understanding of the instructions. No further questions at time of discharge.    New Prescriptions   ONDANSETRON (ZOFRAN) 4 MG TABLET    Take 1 tablet (4 mg total) by mouth every 8 (eight) hours as needed for nausea or vomiting.    Follow Up: Dan Maker., MD [address] [place] [address] [phone]     [facility] [place]PARTMENT [address] [id number] [address] [place] [address] [phone]       Ailany Koren, Canary Brim, MD 02/20/18 [phone]

## 2018-02-19 NOTE — ED Triage Notes (Addendum)
C/o nv/d started 10pm-dizzy last night-none today-was sent by PCP-NAD-steady gait

## 2018-02-19 NOTE — ED Notes (Signed)
ED Provider at bedside. 

## 2018-06-05 ENCOUNTER — Encounter (HOSPITAL_BASED_OUTPATIENT_CLINIC_OR_DEPARTMENT_OTHER): Payer: Self-pay | Admitting: *Deleted

## 2018-06-05 ENCOUNTER — Other Ambulatory Visit: Payer: Self-pay

## 2018-06-05 ENCOUNTER — Emergency Department (HOSPITAL_BASED_OUTPATIENT_CLINIC_OR_DEPARTMENT_OTHER)
Admission: EM | Admit: 2018-06-05 | Discharge: 2018-06-05 | Disposition: A | Discharge status: Home / Self Care | Payer: Medicare Other | Attending: Emergency Medicine | Admitting: Emergency Medicine

## 2018-06-05 DIAGNOSIS — Z7982 Long term (current) use of aspirin: Secondary | ICD-10-CM | POA: Diagnosis not present

## 2018-06-05 DIAGNOSIS — Z79899 Other long term (current) drug therapy: Secondary | ICD-10-CM | POA: Diagnosis not present

## 2018-06-05 DIAGNOSIS — I10 Essential (primary) hypertension: Secondary | ICD-10-CM | POA: Insufficient documentation

## 2018-06-05 DIAGNOSIS — R350 Frequency of micturition: Secondary | ICD-10-CM | POA: Insufficient documentation

## 2018-06-05 LAB — URINALYSIS, ROUTINE W REFLEX MICROSCOPIC
Bilirubin Urine: NEGATIVE
GLUCOSE, UA: NEGATIVE mg/dL
KETONES UR: 15 mg/dL — AB
LEUKOCYTES UA: NEGATIVE
NITRITE: NEGATIVE
PROTEIN: NEGATIVE mg/dL
Specific Gravity, Urine: <1.005 — ABNORMAL LOW (ref 1.005–1.030)
pH: 7 (ref 5.0–8.0)

## 2018-06-05 LAB — URINALYSIS, MICROSCOPIC (REFLEX): WBC UA: NONE SEEN WBC/hpf (ref 0–5)

## 2018-06-05 MED ORDER — PHENAZOPYRIDINE HCL 100 MG PO TABS
95.0000 mg | ORAL_TABLET | Freq: Once | ORAL | Status: AC
  Administered 2018-06-05: 100 mg via ORAL

## 2018-06-05 MED ORDER — PHENAZOPYRIDINE HCL 100 MG PO TABS
ORAL_TABLET | ORAL | Status: AC
  Filled 2018-06-05: qty 1

## 2018-06-05 MED ORDER — PHENAZOPYRIDINE HCL 200 MG PO TABS: 200.0000 mg | ORAL_TABLET | Freq: Three times a day (TID) | ORAL | 0 refills | Status: AC

## 2018-06-05 NOTE — ED Notes (Signed)
ED Provider at bedside. 

## 2018-06-05 NOTE — ED Provider Notes (Signed)
[facility] EMERGENCY DEPARTMENT Provider Note   CSN: [id number] Arrival date & time: 06/05/18  2224     History   Chief Complaint Chief Complaint  Patient presents with  . Urinary Frequency    HPI Kristine Hernandez is a 77 y.o. female.  The history is provided by the patient.  Urinary Frequency  This is a recurrent problem. The current episode started more than 2 days ago. The problem occurs constantly. The problem has not changed since onset.Pertinent negatives include no chest pain, no abdominal pain, no headaches and no shortness of breath. Nothing aggravates the symptoms. Nothing relieves the symptoms. She has tried nothing for the symptoms. The treatment provided no relief.    Past Medical History:  Diagnosis Date  . Hyperchloremia   . Hyperlipidemia 06/06/2015  . Hypertension     Patient Active Problem List   Diagnosis Date Noted  . Hyponatremia 06/06/2015  . UTI (urinary tract infection) 06/06/2015  . Hyperlipidemia 06/06/2015  . Dehydration 06/06/2015    Past Surgical History:  Procedure Laterality Date  . BLADDER SURGERY       OB History   None      Home Medications    Prior to Admission medications   Medication Sig Start Date End Date Taking? Authorizing Provider  acetaminophen (TYLENOL) 500 MG tablet Take 1,000 mg by mouth every 6 (six) hours as needed for moderate pain.    [provider]  aspirin EC 81 MG tablet Take 81 mg by mouth daily.    [provider]  benazepril (LOTENSIN) 20 MG tablet Take 20 mg by mouth daily.    [provider]  BIOTIN PO Take 1 tablet by mouth daily.    [provider]  Calcium Carbonate-Vitamin D (CALTRATE 600+D PO) Take 1 tablet by mouth daily.    [provider]  cholecalciferol (VITAMIN D) 1000 UNITS tablet Take 1,000 Units by mouth daily.    [provider]  docusate sodium (COLACE) 100 MG capsule Take 100 mg by mouth daily.    [provider]    ibuprofen (ADVIL,MOTRIN) 200 MG tablet Take 400 mg by mouth every 6 (six) hours as needed for moderate pain.    [provider]  Multiple Vitamin (MULTIVITAMIN WITH MINERALS) TABS tablet Take 1 tablet by mouth daily.    [provider]  ondansetron (ZOFRAN) 4 MG tablet Take 1 tablet (4 mg total) by mouth every 8 (eight) hours as needed for nausea or vomiting. 02/19/18   Tegeler, Canary Brim, MD  rosuvastatin (CRESTOR) 10 MG tablet Take 10 mg by mouth every Friday.    [provider]    Family History Family History  Problem Relation Age of Onset  . Dementia Mother   . CVA Father     Social History Social History   Tobacco Use  . Smoking status: Never Smoker  . Smokeless tobacco: Never Used  Substance Use Topics  . Alcohol use: Yes    Comment: rare  . Drug use: No     Allergies   Penicillins   Review of Systems Review of Systems  Constitutional: Negative for fever.  Respiratory: Negative for shortness of breath.   Cardiovascular: Negative for chest pain.  Gastrointestinal: Negative for abdominal pain.  Genitourinary: Positive for frequency. Negative for difficulty urinating, dyspareunia, dysuria, enuresis, flank pain, genital sores and hematuria.  Neurological: Negative for headaches.  All other systems reviewed and are negative.    Physical Exam Updated Vital Signs BP Marland Kitchen)  173/88 (BP Location: Right Arm)   Pulse 78   Temp 98.8 F (37.1 C) (Oral)   Resp 18   Ht 5\' 6"  (1.676 m)   Wt 65.3 kg   SpO2 98%   BMI 23.24 kg/m   Physical Exam  Constitutional: She appears well-developed and well-nourished. No distress.  HENT:  Head: Normocephalic and atraumatic.  Mouth/Throat: No oropharyngeal exudate.  Eyes: Pupils are equal, round, and reactive to light. Conjunctivae are normal.  Neck: Normal range of motion. Neck supple.  Cardiovascular: Normal rate, regular rhythm, normal heart sounds and intact distal pulses.  Pulmonary/Chest:  Effort normal and breath sounds normal. No stridor. She has no wheezes. She has no rales.  Abdominal: Soft. Bowel sounds are normal. She exhibits no mass. There is no tenderness. There is no rebound and no guarding.  Musculoskeletal: Normal range of motion.  Neurological: She is alert. She displays normal reflexes.  Skin: Skin is warm and dry. Capillary refill takes less than 2 seconds.  Psychiatric: She has a normal mood and affect.     ED Treatments / Results  Labs (all labs ordered are listed, but only abnormal results are displayed) Labs Reviewed  URINALYSIS, ROUTINE W REFLEX MICROSCOPIC - Abnormal; Notable for the following components:      Result Value   Specific Gravity, Urine <1.005 (*)    Hgb urine dipstick TRACE (*)    Ketones, ur 15 (*)    All other components within normal limits  URINALYSIS, MICROSCOPIC (REFLEX) - Abnormal; Notable for the following components:   Bacteria, UA RARE (*)    All other components within normal limits  URINE CULTURE    EKG None  Radiology No results found.  Procedures Procedures (including critical care time)  Medications Ordered in ED Medications  phenazopyridine (PYRIDIUM) 100 MG tablet (has no administration in time range)  phenazopyridine (PYRIDIUM) tablet 100 mg (100 mg Oral Given 06/05/18 2310)      Final Clinical Impressions(s) / ED Diagnoses    Return for weakness, numbness, changes in vision or speech, fevers >100.4 unrelieved by medication, shortness of breath, intractable vomiting, or diarrhea, abdominal pain, Inability to tolerate liquids or food, cough, altered mental status or any concerns. No signs of systemic illness or infection. The patient is nontoxic-appearing on exam and vital signs are within normal limits.    I have reviewed the triage vital signs and the nursing notes. Pertinent labs &imaging results that were available during my care of the patient were reviewed by me and considered in my medical  decision making (see chart for details).  After history, exam, and medical workup I feel the patient has been appropriately medically screened and is safe for discharge home. Pertinent diagnoses were discussed with the patient. Patient was given return precautions.   Shylah Dossantos, MD 06/05/18 2320

## 2018-06-05 NOTE — ED Triage Notes (Signed)
Pt c/o urinary freq and lower abd pain x 1 day

## 2018-06-05 NOTE — ED Notes (Signed)
Family at bedside. 

## 2018-06-07 LAB — URINE CULTURE: CULTURE: NO GROWTH

## 2024-06-11 DEATH — deceased
